# Patient Record
Sex: Female | Born: 1970 | Hispanic: No | Marital: Married | Smoking: Current every day smoker
Health system: Southern US, Community
[De-identification: ages and names within clinical notes are randomized; demographics above are authoritative.]

## PROBLEM LIST (undated history)

## (undated) DIAGNOSIS — F419 Anxiety disorder, unspecified: Secondary | ICD-10-CM

## (undated) DIAGNOSIS — A159 Respiratory tuberculosis unspecified: Secondary | ICD-10-CM

## (undated) DIAGNOSIS — F3281 Premenstrual dysphoric disorder: Secondary | ICD-10-CM

## (undated) DIAGNOSIS — E28319 Asymptomatic premature menopause: Secondary | ICD-10-CM

## (undated) DIAGNOSIS — Z720 Tobacco use: Secondary | ICD-10-CM

## (undated) HISTORY — DX: Respiratory tuberculosis unspecified: A15.9

## (undated) HISTORY — PX: DILATION AND CURETTAGE OF UTERUS: SHX78

## (undated) HISTORY — PX: LESION REMOVAL: SHX5196

## (undated) HISTORY — DX: Asymptomatic premature menopause: E28.319

## (undated) HISTORY — DX: Anxiety disorder, unspecified: F41.9

## (undated) HISTORY — DX: Premenstrual dysphoric disorder: F32.81

## (undated) HISTORY — DX: Tobacco use: Z72.0

---

## 2004-01-18 ENCOUNTER — Inpatient Hospital Stay: Payer: Self-pay | Admitting: Psychiatry

## 2004-07-07 ENCOUNTER — Ambulatory Visit: Payer: Self-pay | Admitting: Family Medicine

## 2004-07-27 ENCOUNTER — Encounter: Payer: Self-pay | Admitting: Family Medicine

## 2004-07-27 LAB — CONVERTED CEMR LAB

## 2004-08-31 ENCOUNTER — Ambulatory Visit: Payer: Self-pay | Admitting: Family Medicine

## 2004-12-28 ENCOUNTER — Ambulatory Visit: Payer: Self-pay | Admitting: Family Medicine

## 2005-04-21 ENCOUNTER — Ambulatory Visit: Payer: Self-pay | Admitting: Family Medicine

## 2005-07-07 ENCOUNTER — Ambulatory Visit: Payer: Self-pay | Admitting: Family Medicine

## 2005-07-17 ENCOUNTER — Ambulatory Visit: Payer: Self-pay | Admitting: Family Medicine

## 2005-08-09 ENCOUNTER — Ambulatory Visit: Payer: Self-pay | Admitting: Family Medicine

## 2006-02-23 ENCOUNTER — Ambulatory Visit: Payer: Self-pay | Admitting: Family Medicine

## 2006-03-18 ENCOUNTER — Ambulatory Visit: Payer: Self-pay

## 2006-03-23 ENCOUNTER — Ambulatory Visit: Payer: Self-pay

## 2006-03-25 ENCOUNTER — Ambulatory Visit: Payer: Self-pay | Admitting: Family Medicine

## 2006-06-08 ENCOUNTER — Ambulatory Visit: Payer: Self-pay | Admitting: Family Medicine

## 2007-01-18 ENCOUNTER — Telehealth (INDEPENDENT_AMBULATORY_CARE_PROVIDER_SITE_OTHER): Payer: Self-pay | Admitting: *Deleted

## 2007-01-20 ENCOUNTER — Telehealth: Payer: Self-pay | Admitting: Family Medicine

## 2007-01-31 ENCOUNTER — Ambulatory Visit: Payer: Self-pay | Admitting: Family Medicine

## 2007-01-31 DIAGNOSIS — N926 Irregular menstruation, unspecified: Secondary | ICD-10-CM | POA: Insufficient documentation

## 2007-07-17 ENCOUNTER — Ambulatory Visit: Payer: Self-pay | Admitting: Family Medicine

## 2007-07-17 DIAGNOSIS — F4323 Adjustment disorder with mixed anxiety and depressed mood: Secondary | ICD-10-CM | POA: Insufficient documentation

## 2007-07-17 DIAGNOSIS — K219 Gastro-esophageal reflux disease without esophagitis: Secondary | ICD-10-CM | POA: Insufficient documentation

## 2007-07-17 DIAGNOSIS — F17200 Nicotine dependence, unspecified, uncomplicated: Secondary | ICD-10-CM | POA: Insufficient documentation

## 2007-07-17 DIAGNOSIS — J309 Allergic rhinitis, unspecified: Secondary | ICD-10-CM | POA: Insufficient documentation

## 2007-12-27 ENCOUNTER — Telehealth: Payer: Self-pay | Admitting: Family Medicine

## 2008-01-19 ENCOUNTER — Ambulatory Visit: Payer: Self-pay | Admitting: Family Medicine

## 2008-01-19 DIAGNOSIS — N943 Premenstrual tension syndrome: Secondary | ICD-10-CM | POA: Insufficient documentation

## 2008-01-19 DIAGNOSIS — G43909 Migraine, unspecified, not intractable, without status migrainosus: Secondary | ICD-10-CM | POA: Insufficient documentation

## 2008-01-23 ENCOUNTER — Telehealth: Payer: Self-pay | Admitting: Family Medicine

## 2008-05-01 ENCOUNTER — Ambulatory Visit: Payer: Self-pay | Admitting: Family Medicine

## 2008-05-01 LAB — CONVERTED CEMR LAB
Bilirubin Urine: NEGATIVE
Glucose, Urine, Semiquant: NEGATIVE
Ketones, urine, test strip: NEGATIVE
Nitrite: NEGATIVE
Urobilinogen, UA: 0.2
WBC Urine, dipstick: NEGATIVE
pH: 6

## 2008-07-02 ENCOUNTER — Ambulatory Visit: Payer: Self-pay | Admitting: Family Medicine

## 2008-08-16 ENCOUNTER — Telehealth: Payer: Self-pay | Admitting: Family Medicine

## 2008-08-19 ENCOUNTER — Telehealth: Payer: Self-pay | Admitting: Family Medicine

## 2008-08-27 ENCOUNTER — Telehealth: Payer: Self-pay | Admitting: Family Medicine

## 2008-11-12 ENCOUNTER — Telehealth: Payer: Self-pay | Admitting: Family Medicine

## 2008-12-09 ENCOUNTER — Encounter: Payer: Self-pay | Admitting: Family Medicine

## 2009-01-24 ENCOUNTER — Ambulatory Visit: Payer: Self-pay | Admitting: Internal Medicine

## 2009-02-25 ENCOUNTER — Telehealth (INDEPENDENT_AMBULATORY_CARE_PROVIDER_SITE_OTHER): Payer: Self-pay | Admitting: Internal Medicine

## 2009-02-27 ENCOUNTER — Ambulatory Visit: Payer: Self-pay | Admitting: Family Medicine

## 2009-02-27 ENCOUNTER — Telehealth: Payer: Self-pay | Admitting: Family Medicine

## 2009-02-28 ENCOUNTER — Telehealth (INDEPENDENT_AMBULATORY_CARE_PROVIDER_SITE_OTHER): Payer: Self-pay | Admitting: Internal Medicine

## 2009-03-03 ENCOUNTER — Telehealth (INDEPENDENT_AMBULATORY_CARE_PROVIDER_SITE_OTHER): Payer: Self-pay | Admitting: Internal Medicine

## 2009-03-07 ENCOUNTER — Encounter (INDEPENDENT_AMBULATORY_CARE_PROVIDER_SITE_OTHER): Payer: Self-pay | Admitting: Internal Medicine

## 2009-03-10 ENCOUNTER — Telehealth (INDEPENDENT_AMBULATORY_CARE_PROVIDER_SITE_OTHER): Payer: Self-pay | Admitting: Internal Medicine

## 2009-05-03 ENCOUNTER — Ambulatory Visit: Payer: Self-pay | Admitting: Family Medicine

## 2009-05-09 ENCOUNTER — Telehealth: Payer: Self-pay | Admitting: Family Medicine

## 2009-05-14 ENCOUNTER — Ambulatory Visit: Payer: Self-pay | Admitting: Family Medicine

## 2009-06-06 ENCOUNTER — Encounter: Admission: RE | Admit: 2009-06-06 | Discharge: 2009-06-06 | Payer: Self-pay | Admitting: Family Medicine

## 2009-06-06 ENCOUNTER — Ambulatory Visit: Payer: Self-pay | Admitting: Family Medicine

## 2009-06-06 LAB — CONVERTED CEMR LAB
Bilirubin Urine: NEGATIVE
Glucose, Urine, Semiquant: NEGATIVE
Ketones, urine, test strip: NEGATIVE
Nitrite: NEGATIVE
Urobilinogen, UA: 0.2
WBC Urine, dipstick: NEGATIVE

## 2009-08-06 ENCOUNTER — Telehealth: Payer: Self-pay | Admitting: Family Medicine

## 2009-08-07 ENCOUNTER — Ambulatory Visit: Payer: Self-pay | Admitting: Family Medicine

## 2009-08-15 ENCOUNTER — Ambulatory Visit: Payer: Self-pay | Admitting: Family Medicine

## 2009-10-11 ENCOUNTER — Ambulatory Visit: Payer: Self-pay | Admitting: Family Medicine

## 2009-10-11 DIAGNOSIS — N959 Unspecified menopausal and perimenopausal disorder: Secondary | ICD-10-CM | POA: Insufficient documentation

## 2009-11-03 ENCOUNTER — Ambulatory Visit: Payer: Self-pay | Admitting: Family Medicine

## 2009-11-03 DIAGNOSIS — N951 Menopausal and female climacteric states: Secondary | ICD-10-CM | POA: Insufficient documentation

## 2009-11-17 ENCOUNTER — Telehealth: Payer: Self-pay | Admitting: Family Medicine

## 2010-02-24 ENCOUNTER — Telehealth: Payer: Self-pay | Admitting: Family Medicine

## 2010-03-26 ENCOUNTER — Ambulatory Visit: Payer: Self-pay | Admitting: Family Medicine

## 2010-04-28 NOTE — Letter (Signed)
Summary: Out of Work  [facility]  [address] [place], [place] [address]   Phone: [phone]  Fax: [phone]    June 06, 2009   Employee:  Beckey Downing    To Whom It May Concern:   For Medical reasons, please excuse the above named employee from work for the following dates:  Start:   06/06/2009  End:   can return 06/07/2009 if she is feeling better   If you need additional information, please feel free to contact our office.         Sincerely,    Judith Part MD

## 2010-04-28 NOTE — Progress Notes (Signed)
Summary: wants to increase prozac dose  Phone Note Call from Patient Call back at Home Phone [phone]   Caller: Patient Call For: Judith Part MD Summary of Call: Pt is taking prozac 10 mg's daily since august and she thinks she needs to increase her dose.  It is not as effective as it was.  She says her premenstrual sxs are severe.  Uses [facility] Initial call taken by: Lowella Petties CMA, AAMA,  February 24, 2010 4:48 PM  Follow-up for Phone Call        we can go up to 20 mg instead of 10 px written on EMR for call in  update if side eff or worse symptoms  f/u with me in [place] or feb  Follow-up by: Judith Part MD,  February 24, 2010 5:24 PM  Additional Follow-up for Phone Call Additional follow up Details #1::        Left message for patient to call back. Lewanda Rife LPN  February 25, 2010 9:58 AM   Medication phoned [facility] as instructed. Lewanda Rife LPN  February 25, 2010 11:54 AM   Advised pt, follow up appt made. Additional Follow-up by: Lowella Petties CMA, AAMA,  February 25, 2010 12:27 PM    New/Updated Medications: PROZAC 20 MG CAPS (FLUOXETINE HCL) 1 by mouth once daily Prescriptions: PROZAC 20 MG CAPS (FLUOXETINE HCL) 1 by mouth once daily  #30 x 11   Entered and Authorized by:   Judith Part MD   Signed by:   Lewanda Rife LPN on 40/12/2723   Method used:   Telephoned to ...       [facility]  [place] [facility]ail)       [address]       [place], [place]  [address]       Ph: [phone] or [phone]       Fax: [phone]   RxID:   [id number]

## 2010-04-28 NOTE — Assessment & Plan Note (Signed)
Summary: FEVER,HA,THROWING UP,DIZZY/CLE   Vital Signs:  Patient profile:   40 year old female Weight:      144.25 pounds Temp:     98.9 degrees F oral Pulse rate:   84 / minute Pulse rhythm:   regular BP sitting:   114 / 80  (left arm) Cuff size:   regular  Vitals Entered By: Sydell Axon LPN (Aug 07, 2009 12:31 PM) CC: Stomach cramps, diarrhea, headache, dizzy, throwing up and fever, symptoms started Monday   History of Present Illness: Pt here for dizziness which began Tues after she began with AGE sxs on Mon.  She has fever on and off 99+ to 100.2 gets hot and cold with chills. She has headache in the frontal area and eyes. She has no  pain in the ears, no rhinitis and no nasal congestion, no ST except from vomiting, no cough, mld stomach pain and then sore from vomiting. The pain began with crampy feeling with sourness. She was very bloated yesterday. 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 Her stool was black but not tarry this AM.  She has tried [employer] relief pepto pills, IBP and Gasex.   Problems Prior to Update: 1)  Back Pain  (ICD-724.5) 2)  Premenstrual Dysphoric Syndrome  (ICD-625.4) 3)  Migraine  (ICD-346.90) 4)  Tobacco Abuse  (ICD-305.1) 5)  Gerd  (ICD-530.81) 6)  Depression  (ICD-311) 7)  Anxiety  (ICD-300.00) 8)  Allergic Rhinitis  (ICD-477.9) 9)  Irregular Menses  (ICD-626.4)  Medications Prior to Update: 1)  Flonase 50 Mcg/act Susp (Fluticasone Propionate) .... 2 Sprays in Each Nostril Once Daily As Needed 2)  Patanol 0.1 % Soln (Olopatadine Hcl) .Marland Kitchen.. 1 Drop in Each Eye Two Times A Day As Needed Allergy Symptoms 3)  Zyrtec Allergy 10 Mg Tabs (Cetirizine Hcl) .Marland Kitchen.. 1 By Mouth Once Daily in Allergy Season 4)  Tums 500 Mg Chew (Calcium Carbonate Antacid) .... Otc As Directed. 5)  Nexium 40 Mg Cpdr (Esomeprazole Magnesium) .... Take 1 Each Morning 30-60 Min Before Food or Fluids, Then Must Eat or Drink 6)  Promethazine Hcl 25 Mg Tabs (Promethazine  Hcl) .Marland Kitchen.. 1 Q 4 Hours As Needed Nausea 7)  Sam-E 200 Mg Tbec (S-Adenosylmethionine) .... Otc As Directed. 8)  Mobic 15 Mg Tabs (Meloxicam) .Marland Kitchen.. 1 By Mouth Once Daily With Food For 2 Weeks (Stop If Any Gi Upset) 9)  Flexeril 10 Mg Tabs (Cyclobenzaprine Hcl) .... 1/2 To 1 By Mouth Up To Three Times A Day As Needed Back Pain  Warn- This May Sedate  Allergies: 1)  ! Effexor 2)  ! * Depo Provera 3)  ! Paxil  Physical Exam  General:  Well-developed,well-nourished,in no acute distress; alert,appropriate and cooperative throughout examination, looks tired but nontoxic. Head:  normocephalic, atraumatic, and no abnormalities observed.  Sinuses minimally tender in  max distr. Eyes:  Conjunctiva clear bilaterally. Mucosa well hydrated. Ears:  External ear exam shows no significant lesions or deformities.  Otoscopic examination reveals clear canals, tympanic membranes are intact bilaterally without bulging, retraction, inflammation or discharge. Hearing is grossly normal bilaterally. Nose:  External nasal examination shows no deformity or inflammation. Nasal mucosa are pink and moist without lesions or exudates. Mouth:  Oral mucosa and oropharynx without lesions or exudates.  Teeth in good repair. Mucous membr moist. Neck:  nl rom , no bony tenderness  Chest Wall:  No deformities, masses, or tenderness noted. Lungs:  Normal respiratory effort, chest expands symmetrically. Lungs are clear  to auscultation, no crackles or wheezes. Heart:  Normal rate and regular rhythm. S1 and S2 normal without gallop, murmur, click, rub or other extra sounds. Abdomen:  Bowel sounds positive,abdomen soft and non-tender without masses, organomegaly or hernias noted. Bloating resolved, currently flat and NT, no rebound or referred.   Impression & Recommendations:  Problem # 1:  GASTROENTERITIS, ACUTE (ICD-558.9) Assessment New  HAd similar episode in Feb and did well. Still has some Phenergan from then. See  instructions.  Discussed use of medication and role of diet. Encouraged clear liquids and electrolyte replacement fluids. Instructed to call if any signs of worsening dehydration.   Complete Medication List: 1)  Flonase 50 Mcg/act Susp (Fluticasone propionate) .... 2 sprays in each nostril once daily as needed 2)  Patanol 0.1 % Soln (Olopatadine hcl) .Marland Kitchen.. 1 drop in each eye two times a day as needed allergy symptoms 3)  Zyrtec Allergy 10 Mg Tabs (Cetirizine hcl) .Marland Kitchen.. 1 by mouth once daily in allergy season 4)  Tums 500 Mg Chew (Calcium carbonate antacid) .... Otc as directed. 5)  Nexium 40 Mg Cpdr (Esomeprazole magnesium) .... Take 1 each morning 30-60 min before food or fluids, then must eat or drink 6)  Promethazine Hcl 25 Mg Tabs (Promethazine hcl) .Marland Kitchen.. 1 every 4 hours as needed nausea 7)  Sam-e 200 Mg Tbec (S-adenosylmethionine) .... Otc as directed.  Patient Instructions: 1)  Use phenergqn every 6 hrs today. Take again first thing in the AM. Then reassess need as day goes on. 2)  Take clear liqs today...may include Chicken Noodle soup, Coca Cola  and Jello. 3)  If doing well may advance to BRAT tomm. 4)  Avoid milk and milk prods for a week. 5)  Assume black stoool from the Pepto. 6)  May use Gasex for bloating.  7)  RTC if sxs cont.Ifmdizziness worsens of mouth becomes dry or unable to keep down fluids, to ER for IV trmt.  Current Allergies (reviewed today): ! EFFEXOR ! * DEPO PROVERA ! PAXIL

## 2010-04-28 NOTE — Assessment & Plan Note (Signed)
Summary: menstual issues/dlo   Vital Signs:  Patient profile:   40 year old female Height:      66 inches Weight:      146 pounds BMI:     23.65 Temp:     98.2 degrees F oral Pulse rate:   80 / minute Pulse rhythm:   regular BP sitting:   116 / 70  (left arm) Cuff size:   regular  Vitals Entered By: Lewanda Rife LPN (November 03, 2009 12:31 PM) CC: No period for 3 months. Pt went to GYN and told beginning menopause. Wants to talk with DR Milinda Antis   History of Present Illness: in mid june started to have hot flashes and night sweats and went [facility]  did thyroid test and PL? at the office   was told she was in menopause  no menses since mid may   hot flashes are unbearable  no energy  feels overwhelmed - like she does not want to go to work anymore irritable - moody and nervous  this comes and goes is miserable  given supplement with some herbs to help for osteopenia   did not talk about treatments  is supposed to f/u in a month- appt in aug   mother went through early menopause also - at 40   really wants to stop smoking   has taken samE-- and it has helped a little  also bought a menopause product        Allergies: 1)  ! Effexor 2)  ! * Depo Provera 3)  ! Paxil  Past History:  Past Surgical History: Last updated: 07/17/2007 D & C Lesion removed from finger  Family History: Last updated: 07/17/2007 Father: DM Mother:  Siblings:   Social History: Last updated: 01/19/2008 current smoker  heavy caffiene intake   Risk Factors: Smoking Status: current (07/17/2007)  Past Medical History: PMDD anx/depression allergic rhinitis and conjunctivitis tabacco abuse  migraine  early menopause at 37  Review of Systems General:  Complains of fatigue; denies chills, fever, loss of appetite, and malaise. Eyes:  Denies blurring and eye irritation. CV:  Denies chest pain or discomfort, lightheadness, and palpitations. Resp:  Denies cough and  wheezing. GI:  Denies abdominal pain, bloody stools, change in bowel habits, and indigestion. GU:  Denies dysuria and nocturia; some vaginal dryness . MS:  Denies joint pain. Derm:  Denies itching, lesion(s), poor wound healing, and rash. Neuro:  Denies numbness and tingling. Endo:  Complains of heat intolerance; denies excessive thirst. Heme:  Denies abnormal bruising and bleeding.  Physical Exam  General:  Well-developed,well-nourished,in no acute distress; alert,appropriate and cooperative throughout examination Head:  Normocephalic and atraumatic without obvious abnormalities.  Eyes:  vision grossly intact, pupils equal, pupils round, and pupils reactive to light.   Mouth:  pharynx pink and moist.   Neck:  supple with full rom and no masses or thyromegally, no JVD or carotid bruit  Lungs:  CTA with diffusely distant bs  Heart:  Normal rate and regular rhythm. S1 and S2 normal without gallop, murmur, click, rub or other extra sounds. Msk:  No deformity or scoliosis noted of thoracic or lumbar spine.   Extremities:  No clubbing, cyanosis, edema, or deformity noted with normal full range of motion of all joints.   Neurologic:  sensation intact to light touch, gait normal, and DTRs symmetrical and normal.  no tremor  Skin:  Intact without suspicious lesions or rashes Cervical Nodes:  No lymphadenopathy  noted Psych:  slt anxious today good eye contact and comm skills   Impression & Recommendations:  Problem # 1:  MENOPAUSE, EARLY (ICD-627.2) Assessment New with vasomotor and mood symptoms that are almost disabling  spent 25 minutes face to face time with pt , over 50% of which was spent on counseling and coordination of care   will send for gyn records  choose not to start hrt in light of smoking and blood clot risk disc options -incl otc herbs/ black kohash  disc pros/ cons/ risks hrt- will disc with gyn  also give prozac low dose px to try for mood swings (rev poss side eff and  warned to stop if worse dep)  disc imp of ca/ D and exercise for bones also  Complete Medication List: 1)  Flonase 50 Mcg/act Susp (Fluticasone propionate) .... 2 sprays in each nostril once daily as needed 2)  Patanol 0.1 % Soln (Olopatadine hcl) .Marland Kitchen.. 1 drop in each eye two times a day as needed allergy symptoms 3)  Zyrtec Allergy 10 Mg Tabs (Cetirizine hcl) .Marland Kitchen.. 1 by mouth once daily in allergy season 4)  Tums 500 Mg Chew (Calcium carbonate antacid) .... Otc as directed. 5)  Nexium 40 Mg Cpdr (Esomeprazole magnesium) .... Take 1 each morning 30-60 min before food or fluids, then must eat or drink 6)  Promethazine Hcl 25 Mg Tabs (Promethazine hcl) .Marland Kitchen.. 1 every 4 hours as needed nausea 7)  Sam-e 200 Mg Tbec (S-adenosylmethionine) .... Otc as directed. 8)  Robitussin Dm 100-10 Mg/28ml Syrp (Dextromethorphan-guaifenesin) .... Otc as directed. 9)  Prozac 10 Mg Caps (Fluoxetine hcl) .Marland Kitchen.. 1 by mouth once daily in am  Patient Instructions: 1)  please send for last note/ labs [facility] 2)  try black kohash / estroven/ soy products 3)  can also try prozac (if worse with that - stop it or if any other side effects) 4)  try to get enough exercise  5)  the current recommendation for calcium intake is 1200-1500 mg daily with [phone] IU of vitamin D  6)  work on quitting smoking 7)  follow up with your gyn as planned  Prescriptions: PROZAC 10 MG CAPS (FLUOXETINE HCL) 1 by mouth once daily in am  #30 x 11   Entered and Authorized by:   Judith Part MD   Signed by:   Judith Part MD on 11/03/2009   Method used:   Print then Give to Patient   RxID:   [id number]   Current Allergies (reviewed today): ! EFFEXOR ! * DEPO PROVERA ! PAXIL

## 2010-04-28 NOTE — Assessment & Plan Note (Signed)
Summary: headaches since last sun/nausea   Vital Signs:  Patient profile:   40 year old female Height:      66 inches (167.64 cm) Weight:      145 pounds (65.91 kg) BMI:     23.49 O2 Sat:      99 % on Room air Temp:     98.2 degrees F (36.78 degrees C) oral Pulse rate:   80 / minute BP sitting:   126 / 82  (left arm) Cuff size:   regular  Vitals Entered By: Brenton Grills MA (October 11, 2009 9:39 AM)  O2 Flow:  Room air CC: pt c/o HA x 6 days with nausea/aj   CC:  pt c/o HA x 6 days with nausea/aj.  History of Present Illness: 40 y/o fem smoker who comes in for eval of DUB/headaches x 6 days.  IUD by gyn.last gyn visit 2 years ago..........no mensesw in may, then 3 days of spotting 6/16 to 6/19.hot flushers too.....Marland Kitchensmoker.10 day  ha on top of head.comes/goes.a 5 (1 to 1o).........Marland Kitchenneuro ros neg  Current Medications (verified): 1)  Flonase 50 Mcg/act Susp (Fluticasone Propionate) .... 2 Sprays in Each Nostril Once Daily As Needed 2)  Patanol 0.1 % Soln (Olopatadine Hcl) .Marland Kitchen.. 1 Drop in Each Eye Two Times A Day As Needed Allergy Symptoms 3)  Zyrtec Allergy 10 Mg Tabs (Cetirizine Hcl) .Marland Kitchen.. 1 By Mouth Once Daily in Allergy Season 4)  Tums 500 Mg Chew (Calcium Carbonate Antacid) .... Otc As Directed. 5)  Nexium 40 Mg Cpdr (Esomeprazole Magnesium) .... Take 1 Each Morning 30-60 Min Before Food or Fluids, Then Must Eat or Drink 6)  Promethazine Hcl 25 Mg Tabs (Promethazine Hcl) .Marland Kitchen.. 1 Every 4 Hours As Needed Nausea 7)  Sam-E 200 Mg Tbec (S-Adenosylmethionine) .... Otc As Directed. 8)  Robitussin Dm 100-10 Mg/2ml Syrp (Dextromethorphan-Guaifenesin) .... Otc As Directed. 9)  Zithromax Z-Pak 250 Mg Tabs (Azithromycin) .... Take By Mouth As Directed 10)  Guaifenesin-Codeine 100-10 Mg/28ml Soln (Guaifenesin-Codeine) .Marland Kitchen.. 1-2 Teaspoons By Mouth At Bedtime As Needed Severe Cough  Allergies (verified): 1)  ! Effexor 2)  ! * Depo Provera 3)  ! Paxil  Past History:  Past medical, surgical,  family and social histories (including risk factors) reviewed, and no changes noted (except as noted below).  Past Medical History: Reviewed history from 07/02/2008 and no changes required. PMDD anx/depression allergic rhinitis and conjunctivitis tabacco abuse  migraine   Past Surgical History: Reviewed history from 07/17/2007 and no changes required. D & C Lesion removed from finger  Family History: Reviewed history from 07/17/2007 and no changes required. Father: DM Mother:  Siblings:   Social History: Reviewed history from 01/19/2008 and no changes required. current smoker  heavy caffiene intake   Review of Systems      See HPI  Physical Exam  General:  Well-developed,well-nourished,in no acute distress; alert,appropriate and cooperative throughout examination Head:  Normocephalic and atraumatic without obvious abnormalities. No apparent alopecia or balding. Eyes:  No corneal or conjunctival inflammation noted. EOMI. Perrla. Funduscopic exam benign, without hemorrhages, exudates or papilledema. Vision grossly normal. Ears:  External ear exam shows no significant lesions or deformities.  Otoscopic examination reveals clear canals, tympanic membranes are intact bilaterally without bulging, retraction, inflammation or discharge. Hearing is grossly normal bilaterally. Nose:  External nasal examination shows no deformity or inflammation. Nasal mucosa are pink and moist without lesions or exudates. Mouth:  Oral mucosa and oropharynx without lesions or exudates.  Teeth in good repair.  Neurologic:  No cranial nerve deficits noted. Station and gait are normal. Plantar reflexes are down-going bilaterally. DTRs are symmetrical throughout. Sensory, motor and coordinative functions appear intact.   Problems:  Medical Problems Added: 1)  Dx of Disorder, Menopausal Nos  (ICD-627.9)  Impression & Recommendations:  Problem # 1:  DISORDER, MENOPAUSAL NOS (ICD-627.9) Assessment  New  Complete Medication List: 1)  Flonase 50 Mcg/act Susp (Fluticasone propionate) .... 2 sprays in each nostril once daily as needed 2)  Patanol 0.1 % Soln (Olopatadine hcl) .Marland Kitchen.. 1 drop in each eye two times a day as needed allergy symptoms 3)  Zyrtec Allergy 10 Mg Tabs (Cetirizine hcl) .Marland Kitchen.. 1 by mouth once daily in allergy season 4)  Tums 500 Mg Chew (Calcium carbonate antacid) .... Otc as directed. 5)  Nexium 40 Mg Cpdr (Esomeprazole magnesium) .... Take 1 each morning 30-60 min before food or fluids, then must eat or drink 6)  Promethazine Hcl 25 Mg Tabs (Promethazine hcl) .Marland Kitchen.. 1 every 4 hours as needed nausea 7)  Sam-e 200 Mg Tbec (S-adenosylmethionine) .... Otc as directed. 8)  Robitussin Dm 100-10 Mg/66ml Syrp (Dextromethorphan-guaifenesin) .... Otc as directed. 9)  Zithromax Z-pak 250 Mg Tabs (Azithromycin) .... Take by mouth as directed 10)  Guaifenesin-codeine 100-10 Mg/2ml Soln (Guaifenesin-codeine) .Marland Kitchen.. 1-2 teaspoons by mouth at bedtime as needed severe cough  Patient Instructions: 1)  see gyn asap for eval ,and pcp to start smoking sessesion program

## 2010-04-28 NOTE — Assessment & Plan Note (Signed)
Summary: VOMITTING/NAUSEA/DLO   Vital Signs:  Patient profile:   40 year old female Weight:      145 pounds Temp:     98.1 degrees F oral BP sitting:   102 / 70  (left arm) Cuff size:   regular  Vitals Entered By: Alfred Levins, CMA (May 03, 2009 9:26 AM) CC: n/v, dizziness, sinus pressure x4 days   History of Present Illness: Here for 2 days of mild HA, dizziness, ST, dry cough, and vomitting. No fever or body aches or diarrhea. Drinking plenty of fluids. Using Tylenol and Pepto-Bismol. Her daughter had the same symptoms earlier in the week, and now she is back to normal.   Allergies: 1)  ! Effexor 2)  ! * Depo Provera  Past History:  Past Medical History: Reviewed history from 07/02/2008 and no changes required. PMDD anx/depression allergic rhinitis and conjunctivitis tabacco abuse  migraine   Past Surgical History: Reviewed history from 07/17/2007 and no changes required. D & C Lesion removed from finger  Review of Systems  The patient denies anorexia, fever, weight loss, weight gain, vision loss, decreased hearing, hoarseness, chest pain, syncope, dyspnea on exertion, peripheral edema, hemoptysis, abdominal pain, melena, hematochezia, severe indigestion/heartburn, hematuria, incontinence, genital sores, muscle weakness, suspicious skin lesions, transient blindness, difficulty walking, depression, unusual weight change, abnormal bleeding, enlarged lymph nodes, angioedema, breast masses, and testicular masses.    Physical Exam  General:  Well-developed,well-nourished,in no acute distress; alert,appropriate and cooperative throughout examination Head:  Normocephalic and atraumatic without obvious abnormalities. No apparent alopecia or balding. Eyes:  No corneal or conjunctival inflammation noted. EOMI. Perrla. Funduscopic exam benign, without hemorrhages, exudates or papilledema. Vision grossly normal. Ears:  External ear exam shows no significant lesions or  deformities.  Otoscopic examination reveals clear canals, tympanic membranes are intact bilaterally without bulging, retraction, inflammation or discharge. Hearing is grossly normal bilaterally. Nose:  External nasal examination shows no deformity or inflammation. Nasal mucosa are pink and moist without lesions or exudates. Mouth:  Oral mucosa and oropharynx without lesions or exudates.  Teeth in good repair. Neck:  No deformities, masses, or tenderness noted. Lungs:  Normal respiratory effort, chest expands symmetrically. Lungs are clear to auscultation, no crackles or wheezes. Heart:  Normal rate and regular rhythm. S1 and S2 normal without gallop, murmur, click, rub or other extra sounds. Abdomen:  Bowel sounds positive,abdomen soft and non-tender without masses, organomegaly or hernias noted.   Impression & Recommendations:  Problem # 1:  VIRAL INFECTION (ICD-079.99)  Complete Medication List: 1)  Prilosec 20 Mg Cpdr (Omeprazole) .... 2 by mouth once daily 2)  Paxil 20 Mg Tabs (Paroxetine hcl) .Marland Kitchen.. 1 by mouth once daily two weeks prior to menstrual cycle then 1/2 tablet daily 3)  Flonase 50 Mcg/act Susp (Fluticasone propionate) .... 2 sprays in each nostril once daily as needed 4)  Patanol 0.1 % Soln (Olopatadine hcl) .Marland Kitchen.. 1 drop in each eye two times a day as needed allergy symptoms 5)  Zyrtec Allergy 10 Mg Tabs (Cetirizine hcl) .Marland Kitchen.. 1 by mouth once daily in allergy season 6)  Tums 500 Mg Chew (Calcium carbonate antacid) .... Otc as directed. 7)  Dexilant 60 Mg Cpdr (Dexlansoprazole) .... Take 1 each morning before food or drink by 30-34min 8)  Nexium 40 Mg Cpdr (Esomeprazole magnesium) .... Take 1 each morning 30-60 min before food or fluids, then must eat or drink 9)  Promethazine Hcl 25 Mg Tabs (Promethazine hcl) .Marland Kitchen.. 1 q 4 hours as needed  nausea  Patient Instructions: 1)  rest, fluids, Phenergan as needed for nausea. 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  2)  Please  schedule a follow-up appointment as needed .  Prescriptions: PROMETHAZINE HCL 25 MG TABS (PROMETHAZINE HCL) 1 q 4 hours as needed nausea  #30 x 1   Entered and Authorized by:   Nelwyn Salisbury MD   Signed by:   Nelwyn Salisbury MD on 05/03/2009   Method used:   [facility]        [facility]  [place] [facility]       [address]       [place], [place]  [address]       Ph: [phone] or [phone]       Fax: [phone]   RxID:   [id number]

## 2010-04-28 NOTE — Progress Notes (Signed)
Summary: Paroxetine refill  Phone Note Refill Request Call back at [phone] ([facility]) Message from:  [facility] on November 17, 2009 11:55 AM  Electronic request for Paroxetine 20mg  1 by mouth once daily. Not on med list. Please advise   Method Requested: Electronic Initial call taken by: Janee Morn CMA Duncan Dull),  November 17, 2009 11:56 AM  Follow-up for Phone Call        med list has prozac instead-please check with her Follow-up by: Judith Part MD,  November 17, 2009 1:22 PM  Additional Follow-up for Phone Call Additional follow up Details #1::        Spoke with pt she said was error she does not take Paroxetine. Notified Nicole at [facility] to d/c Paroxetine refills.Lewanda Rife LPN  November 17, 2009 2:26 PM

## 2010-04-28 NOTE — Assessment & Plan Note (Signed)
Summary: BACK PAIN/CLE   Vital Signs:  Patient profile:   40 year old female Height:      66 inches Weight:      145.75 pounds BMI:     23.61 Temp:     98.4 degrees F oral Pulse rate:   72 / minute Pulse rhythm:   regular BP sitting:   104 / 68  (left arm) Cuff size:   regular  Vitals Entered By: Lewanda Rife LPN (June 06, 2009 11:34 AM)  History of Present Illness: has been having back ache for 5-6 mo -- thought due to menses now is constant  is R lower back spreading down into her buttocks similar to what she had last year  worse to bend and wear heels  no good position  walking makes it throb a bit  some otc ibuprofen or tylenol occas   ua looks clear today no urine symptosm - just an acidy smell   back pain does go down her R leg  no change in blader or bowel control no numb or weakness or trouble walking   Allergies: 1)  ! Effexor 2)  ! * Depo Provera 3)  ! Paxil  Past History:  Past Medical History: Last updated: 07/02/2008 PMDD anx/depression allergic rhinitis and conjunctivitis tabacco abuse  migraine   Past Surgical History: Last updated: 07/17/2007 D & C Lesion removed from finger  Family History: Last updated: 07/17/2007 Father: DM Mother:  Siblings:   Social History: Last updated: 01/19/2008 current smoker  heavy caffiene intake   Risk Factors: Smoking Status: current (07/17/2007)  Review of Systems General:  Denies chills, fatigue, fever, loss of appetite, malaise, weakness, and weight loss. Eyes:  Denies blurring. CV:  Denies chest pain or discomfort and palpitations. Resp:  Denies cough and shortness of breath. GI:  Denies abdominal pain, change in bowel habits, indigestion, loss of appetite, and nausea. GU:  Denies discharge, dysuria, hematuria, and urinary frequency. MS:  Complains of low back pain and stiffness; denies joint redness, joint swelling, cramps, and muscle weakness. Derm:  Denies lesion(s), poor wound healing,  and rash. Neuro:  Denies numbness, tingling, and weakness. Heme:  Denies abnormal bruising and bleeding.  Physical Exam  General:  Well-developed,well-nourished,in no acute distress; alert,appropriate and cooperative throughout examination Head:  normocephalic, atraumatic, and no abnormalities observed.   Eyes:  vision grossly intact, pupils equal, pupils round, and pupils reactive to light.   Neck:  nl rom , no bony tenderness  Chest Wall:  No deformities, masses, or tenderness noted. Lungs:  Normal respiratory effort, chest expands symmetrically. Lungs are clear to auscultation, no crackles or wheezes. Heart:  Normal rate and regular rhythm. S1 and S2 normal without gallop, murmur, click, rub or other extra sounds. Abdomen:  no suprapubic tenderness or fullness felt  Msk:  tender L5- S1 bony pain in R perilumbar musculature and also SI joint some pain on slr in back (not leg) some pain on ext rot of R hip  flex 30 deg andext 10 deg with pain pain on R lat bend nl gait  no cva tenderness  Extremities:  No clubbing, cyanosis, edema, or deformity noted with normal full range of motion of all joints.   Neurologic:  strength normal in all extremities, sensation intact to light touch, gait normal, and DTRs symmetrical and normal.   Skin:  Intact without suspicious lesions or rashes Inguinal Nodes:  No significant adenopathy Psych:  normal affect, talkative and pleasant    Impression &  Recommendations:  Problem # 1:  BACK PAIN (ICD-724.5) Assessment Deteriorated recurrent R low back pain with some rad to leg suspect muscle spasm with sciatica - no neurol changes on exam sent for x ray in light of length of symptoms  nsaid and muscle relaxer as needed with caution back handout aafp heat and stretches as tol  then will update further  Her updated medication list for this problem includes:    Mobic 15 Mg Tabs (Meloxicam) .Marland Kitchen... 1 by mouth once daily with food for 2 weeks (stop if any  gi upset)    Flexeril 10 Mg Tabs (Cyclobenzaprine hcl) .Marland Kitchen... 1/2 to 1 by mouth up to three times a day as needed back pain  warn- this may sedate  Orders: Radiology Referral (Radiology)  Complete Medication List: 1)  Flonase 50 Mcg/act Susp (Fluticasone propionate) .... 2 sprays in each nostril once daily as needed 2)  Patanol 0.1 % Soln (Olopatadine hcl) .Marland Kitchen.. 1 drop in each eye two times a day as needed allergy symptoms 3)  Zyrtec Allergy 10 Mg Tabs (Cetirizine hcl) .Marland Kitchen.. 1 by mouth once daily in allergy season 4)  Tums 500 Mg Chew (Calcium carbonate antacid) .... Otc as directed. 5)  Nexium 40 Mg Cpdr (Esomeprazole magnesium) .... Take 1 each morning 30-60 min before food or fluids, then must eat or drink 6)  Promethazine Hcl 25 Mg Tabs (Promethazine hcl) .Marland Kitchen.. 1 q 4 hours as needed nausea 7)  Sam-e 200 Mg Tbec (S-adenosylmethionine) .... Otc as directed. 8)  Mobic 15 Mg Tabs (Meloxicam) .Marland Kitchen.. 1 by mouth once daily with food for 2 weeks (stop if any gi upset) 9)  Flexeril 10 Mg Tabs (Cyclobenzaprine hcl) .... 1/2 to 1 by mouth up to three times a day as needed back pain  warn- this may sedate  Patient Instructions: 1)  we will schedule x ray at check out  2)  update me if pain worsens  3)  caution with muscle relaxer- can sedate Prescriptions: FLEXERIL 10 MG TABS (CYCLOBENZAPRINE HCL) 1/2 to 1 by mouth up to three times a day as needed back pain  warn- this may sedate  #30 x 0   Entered and Authorized by:   Judith Part MD   Signed by:   Judith Part MD on 06/06/2009   Method used:   Print then Give to Patient   RxID:   [id number] MOBIC 15 MG TABS (MELOXICAM) 1 by mouth once daily with food for 2 weeks (stop if any GI upset)  #14 x 0   Entered and Authorized by:   Judith Part MD   Signed by:   Judith Part MD on 06/06/2009   Method used:   Print then Give to Patient   RxID:   [id number]   Current Allergies (reviewed today): ! EFFEXOR ! * DEPO PROVERA !  PAXIL  Laboratory Results   Urine Tests  Date/Time Received: June 06, 2009 11:36 AM  Date/Time Reported: June 06, 2009 11:36 AM   Routine Urinalysis   Color: yellow Appearance: Hazy Glucose: negative   (Normal Range: Negative) Bilirubin: negative   (Normal Range: Negative) Ketone: negative   (Normal Range: Negative) Spec. Gravity: 1.020   (Normal Range: 1.003-1.035) Blood: trace-intact   (Normal Range: Negative) pH: 6.0   (Normal Range: 5.0-8.0) Protein: trace   (Normal Range: Negative) Urobilinogen: 0.2   (Normal Range: 0-1) Nitrite: negative   (Normal Range: Negative) Leukocyte Esterace: negative   (Normal Range: Negative)

## 2010-04-28 NOTE — Letter (Signed)
Summary: Out of Work  [facility]  [address] [place], [place] [address]   Phone: [phone]  Fax: [phone]    Aug 15, 2009   Employee:  Beckey Downing    To Whom It May Concern:   For Medical reasons, please excuse the above named employee from work for the following dates:  Start:   08/15/2009  End:   can retrun 5/23 if she is feeling better   If you need additional information, please feel free to contact our office.         Sincerely,    Judith Part MD

## 2010-04-28 NOTE — Progress Notes (Signed)
Summary: vomiting and diarrhea  Phone Note Call from Patient   Caller: Patient Call For: Judith Part MD Summary of Call: Pt called complaining of vomiting and diarrhea since yesterday. No fever.  She has only vomited x once today.  Advised pt to rest, take frequent small sips of clear fluids, avoid dairy products and no solid foods for now.  She will call back if not better. Initial call taken by: Lowella Petties CMA,  Aug 06, 2009 12:35 PM  Follow-up for Phone Call        agree with that advice  f/u or seek care if abd pain/ fever over 102 / worse or signs/ symptoms of dehydration Follow-up by: Judith Part MD,  Aug 06, 2009 1:19 PM  Additional Follow-up for Phone Call Additional follow up Details #1::        Advised pt. Additional Follow-up by: Lowella Petties CMA,  Aug 06, 2009 2:47 PM

## 2010-04-28 NOTE — Progress Notes (Signed)
Summary: sinus symptoms  Phone Note Call from Patient   Caller: Patient Call For: Judith Part MD Summary of Call: [facility] and was dx'd with a virus.  She still has dizziness, cough, nausea, sinus pain, nose is dry and sore. She is requesting an abx.  I advised pt that she would need to be seen, suggested she go back [facility] tomorrow and see Dr. Hetty Ely.  She said she was just there last week, said she would go to urgent care. Initial call taken by: Lowella Petties CMA,  May 09, 2009 10:15 AM  Follow-up for Phone Call        Pt called stated she had been advised by triage last week that it was more than likely a virus and triage asked if she'd like to schedule an appt.  Pt said she wanted same day appt and if couldn't get it would go to Sat clinic.  Pt stated she went to Sat clinic and was told it was a virus.  Pt also went to urgent care on Sat night and says they did flu test and gave her Tamiflu.  Pt says still having all the same symptoms and is requesting an antibiotic. Verified phone number with patient and asked if I could check with the doctor and call her right back.  She agreed that this would be okay.  Spoke with Dr. Milinda Antis and Dr. Milinda Antis said she will review chart and follow up.  Called patient back and left a voicemail that Dr. Milinda Antis would review after lunch and follow up.   Follow-up by: Clarisa Schools,  May 09, 2009 12:26 PM  Additional Follow-up for Phone Call Additional follow up Details #1::        can go ahead and tx for possible sinusitis and then have her f/u next week [facility] clinic if worse tomorrow px written on EMR for call in-- amox   Additional Follow-up by: Judith Part MD,  May 09, 2009 1:29 PM    New/Updated Medications: AMOXICILLIN 500 MG CAPS (AMOXICILLIN) 1 by mouth three times a day for 10 days Prescriptions: AMOXICILLIN 500 MG CAPS (AMOXICILLIN) 1 by mouth three times a day for 10 days   #30 x 0   Entered by:   Delilah Shan CMA (AAMA)   Authorized by:   Judith Part MD   Signed by:   Delilah Shan CMA (AAMA) on 05/09/2009   Method used:   Electronically to        [facility]  [address] (retail)       [address]       [place], [place]  [address]       Ph: [phone] or [phone]       Fax: [phone]   RxID:   [id number] AMOXICILLIN 500 MG CAPS (AMOXICILLIN) 1 by mouth three times a day for 10 days  #30 x 0   Entered and Authorized by:   Judith Part MD   Signed by:   Judith Part MD on 05/09/2009   Method used:   Telephoned to ...       [facility] (retail)       [address]       [place], [place]  [address]       Ph: [phone] or [phone]       Fax: [phone]   RxID:   [id number]  Electronically to  [facility] (retail)       [address]       [place], [place]  [address]       Ph: [phone] or [phone]       Fax: [phone]

## 2010-04-28 NOTE — Assessment & Plan Note (Signed)
Summary: FEVER/CONGESTION/COUGH/SORE THROAT/DLO   Vital Signs:  Patient profile:   40 year old female Height:      66 inches Weight:      144.50 pounds BMI:     23.41 Temp:     101 degrees F oral Pulse rate:   88 / minute Pulse rhythm:   regular BP sitting:   110 / 80  (left arm) Cuff size:   regular  Vitals Entered By: Lewanda Rife LPN (Aug 15, 2009 9:32 AM) CC: fever, head and chest congestion, non productive cough   History of Present Illness: has bad cold symptoms  started on tuesday  throat was scratchy - then chest buring with cough  face and throat hurt  post nasal drip- no color  non prod cough   101 fever -- aches all over - legs and back   taking some robitussin and tylenol   still smokes-- abut the same      Allergies: 1)  ! Effexor 2)  ! * Depo Provera 3)  ! Paxil  Past History:  Past Medical History: Last updated: 07/02/2008 PMDD anx/depression allergic rhinitis and conjunctivitis tabacco abuse  migraine   Past Surgical History: Last updated: 07/17/2007 D & C Lesion removed from finger  Family History: Last updated: 07/17/2007 Father: DM Mother:  Siblings:   Social History: Last updated: 01/19/2008 current smoker  heavy caffiene intake   Risk Factors: Smoking Status: current (07/17/2007)  Review of Systems General:  Complains of chills, fatigue, fever, loss of appetite, and malaise. Eyes:  Denies blurring and eye irritation. ENT:  Complains of nasal congestion, postnasal drainage, sinus pressure, and sore throat. CV:  Denies chest pain or discomfort, palpitations, shortness of breath with exertion, and swelling of feet. Resp:  Complains of cough, sputum productive, and wheezing; denies shortness of breath. Derm:  Denies lesion(s), poor wound healing, and rash.  Physical Exam  Head:  mild maxillary sinus tenderness Lungs:  harsh bs at bases/occ rhonchi and end exp wheeze (mild) not sob  harsh cough   Impression &  Recommendations:  Problem # 1:  BRONCHITIS- ACUTE (ICD-466.0) Assessment New in smoker with minimal reactive airways after uri  tx with zithromax  recommend sympt care- see pt instructions  -- use codiene cough syrup with caution pt advised to update me if symptoms worsen or do not improve - esp if worse wheezing Her updated medication list for this problem includes:    Robitussin Dm 100-10 Mg/77ml Syrp (Dextromethorphan-guaifenesin) ..... Otc as directed.    Zithromax Z-pak 250 Mg Tabs (Azithromycin) .Marland Kitchen... Take by mouth as directed    Guaifenesin-codeine 100-10 Mg/74ml Soln (Guaifenesin-codeine) .Marland Kitchen... 1-2 teaspoons by mouth at bedtime as needed severe cough  Problem # 2:  TOBACCO ABUSE (ICD-305.1) Assessment: Unchanged discussed in detail risks of smoking, and possible outcomes including COPD, vascular dz, cancer and also respiratory infections/sinus problems  adv to quit   Complete Medication List: 1)  Flonase 50 Mcg/act Susp (Fluticasone propionate) .... 2 sprays in each nostril once daily as needed 2)  Patanol 0.1 % Soln (Olopatadine hcl) .Marland Kitchen.. 1 drop in each eye two times a day as needed allergy symptoms 3)  Zyrtec Allergy 10 Mg Tabs (Cetirizine hcl) .Marland Kitchen.. 1 by mouth once daily in allergy season 4)  Tums 500 Mg Chew (Calcium carbonate antacid) .... Otc as directed. 5)  Nexium 40 Mg Cpdr (Esomeprazole magnesium) .... Take 1 each morning 30-60 min before food or fluids, then must eat or drink 6)  Promethazine  Hcl 25 Mg Tabs (Promethazine hcl) .Marland Kitchen.. 1 every 4 hours as needed nausea 7)  Sam-e 200 Mg Tbec (S-adenosylmethionine) .... Otc as directed. 8)  Robitussin Dm 100-10 Mg/75ml Syrp (Dextromethorphan-guaifenesin) .... Otc as directed. 9)  Zithromax Z-pak 250 Mg Tabs (Azithromycin) .... Take by mouth as directed 10)  Guaifenesin-codeine 100-10 Mg/39ml Soln (Guaifenesin-codeine) .Marland Kitchen.. 1-2 teaspoons by mouth at bedtime as needed severe cough  Patient Instructions: 1)  take the zithromax for  bronchitis 2)  think hard about quitting smoking  3)  drink lots of fluids 4)  robitussin DM is ok for day 5)  try the px cough med at night - it can sedate  6)  update me if not starting to feel better next week  7)  use your inhaler as needed  Prescriptions: GUAIFENESIN-CODEINE 100-10 MG/5ML SOLN (GUAIFENESIN-CODEINE) 1-2 teaspoons by mouth at bedtime as needed severe cough  #120cc x 0   Entered and Authorized by:   Judith Part MD   Signed by:   Judith Part MD on 08/15/2009   Method used:   Print then Give to Patient   RxID:   [id number] ZITHROMAX Z-PAK 250 MG TABS (AZITHROMYCIN) take by mouth as directed  #1 pack x 0   Entered and Authorized by:   Judith Part MD   Signed by:   Judith Part MD on 08/15/2009   Method used:   Print then Give to Patient   RxID:   [id number]   Current Allergies (reviewed today): ! EFFEXOR ! * DEPO PROVERA ! PAXIL

## 2010-04-28 NOTE — Assessment & Plan Note (Signed)
Summary: ALLERGIES/CLE   Vital Signs:  Patient profile:   40 year old female Height:      66 inches Weight:      148.75 pounds BMI:     24.10 Temp:     97.7 degrees F oral Pulse rate:   76 / minute Pulse rhythm:   regular BP sitting:   100 / 70  (left arm) Cuff size:   regular  Vitals Entered By: Lewanda Rife LPN (May 14, 2009 12:40 PM)  History of Present Illness: a bad winter with respiratory illnesses  continuous sinus probs since oct   influenza - went to the walk in clinic  did give her tamiflu for the flu - and finished that then dx with uri viral    px amox over the phone for likely sinus infx-- increased sinus pain and fever  now is having a lot of ear problems -- full and popping but not pain  feels like something is walking in her ears  still blowing out green nasal d/c -- but is slowing down a bit  facial pain is now imp - just dryness  no fever   itchy throat from allergies- weather change  using zyrtec and also flonase -- 2 in each nostril once per day or splits it up   no more n/v    Allergies: 1)  ! Effexor 2)  ! * Depo Provera 3)  ! Paxil  Past History:  Past Medical History: Last updated: 07/02/2008 PMDD anx/depression allergic rhinitis and conjunctivitis tabacco abuse  migraine   Past Surgical History: Last updated: 07/17/2007 D & C Lesion removed from finger  Family History: Last updated: 07/17/2007 Father: DM Mother:  Siblings:   Social History: Last updated: 01/19/2008 current smoker  heavy caffiene intake   Risk Factors: Smoking Status: current (07/17/2007)  Review of Systems General:  Denies fatigue, fever, loss of appetite, and malaise. Eyes:  Denies blurring and eye pain. ENT:  Complains of earache, hoarseness, nasal congestion, postnasal drainage, and sinus pressure; denies ear discharge and sore throat. CV:  Denies chest pain or discomfort, lightheadness, palpitations, and shortness of breath with  exertion. Resp:  Denies cough and wheezing. GI:  Denies abdominal pain, bloody stools, change in bowel habits, and constipation. MS:  Denies joint pain. Derm:  Denies lesion(s), poor wound healing, and rash. Endo:  Denies excessive thirst and excessive urination.  Physical Exam  General:  Well-developed,well-nourished,in no acute distress; alert,appropriate and cooperative throughout examination Head:  normocephalic, atraumatic, and no abnormalities observed.  bilat max sinus tenderness Eyes:  vision grossly intact, pupils equal, pupils round, pupils reactive to light, and no injection.   Ears:  R ear normal and L ear normal.  - TMs are dull with some cerumen on R  Nose:  nares are injected and congested bilaterally  Mouth:  pharynx pink and moist, no erythema, and no exudates.   Neck:  No deformities, masses, or tenderness noted. Lungs:  Normal respiratory effort, chest expands symmetrically. Lungs are clear to auscultation, no crackles or wheezes. Heart:  Normal rate and regular rhythm. S1 and S2 normal without gallop, murmur, click, rub or other extra sounds. Skin:  Intact without suspicious lesions or rashes Cervical Nodes:  No lymphadenopathy noted Psych:  normal affect, talkative and pleasant    Impression & Recommendations:  Problem # 1:  SINUSITIS - ACUTE-NOS (ICD-461.9) Assessment New overall improved with amox - still with symptoms of ETD enc to continue flonase/ finish abx  nasal saline  and mucinex for depression  update if not further imp in 1 wk Her updated medication list for this problem includes:    Flonase 50 Mcg/act Susp (Fluticasone propionate) .Marland Kitchen... 2 sprays in each nostril once daily as needed    Amoxicillin 500 Mg Caps (Amoxicillin) .Marland Kitchen... 1 by mouth three times a day for 10 days  Complete Medication List: 1)  Flonase 50 Mcg/act Susp (Fluticasone propionate) .... 2 sprays in each nostril once daily as needed 2)  Patanol 0.1 % Soln (Olopatadine hcl) .Marland Kitchen.. 1  drop in each eye two times a day as needed allergy symptoms 3)  Zyrtec Allergy 10 Mg Tabs (Cetirizine hcl) .Marland Kitchen.. 1 by mouth once daily in allergy season 4)  Tums 500 Mg Chew (Calcium carbonate antacid) .... Otc as directed. 5)  Nexium 40 Mg Cpdr (Esomeprazole magnesium) .... Take 1 each morning 30-60 min before food or fluids, then must eat or drink 6)  Promethazine Hcl 25 Mg Tabs (Promethazine hcl) .Marland Kitchen.. 1 q 4 hours as needed nausea 7)  Amoxicillin 500 Mg Caps (Amoxicillin) .Marland Kitchen.. 1 by mouth three times a day for 10 days  Patient Instructions: 1)  finish the amoxicillin as planned  2)  try plain mucinex two times a day as directed for congestion in head and chest  3)  drink lots of water  4)  use nasal saline spray several times daily 5)  stay on allergy medicines  6)  update me if ear symptoms do not improve in a week  Current Allergies (reviewed today): ! EFFEXOR ! * DEPO PROVERA ! PAXIL

## 2010-04-30 NOTE — Assessment & Plan Note (Signed)
Summary: FOLLOW UP   Vital Signs:  Patient profile:   40 year old female Weight:      151 pounds BMI:     24.46 Temp:     97.7 degrees F oral Pulse rate:   72 / minute Pulse rhythm:   regular BP sitting:   112 / 80  (left arm) Cuff size:   regular  Vitals Entered By: Mervin Hack CMA Duncan Dull) (March 26, 2010 8:37 AM) CC: follow-up visit   History of Present Illness: here for f/u of mood disorder primarily linked to menses/ pmdd and some perimenopausal change   wt is up 5 lb-- she is upset about recent weight gain    bp good 112/80  called and we did inc prozac to 20 mg one daily   (in past non tol of paxil and effexor) prior to that was irrtable/ frustrated and miserable  after menses there is also improvement   this month did not have the pmdd -- and thankful for that  peroids are irregular  was out of the country -- just got back last night -- to [place]  got to relax and sit on the beach -- that helped a lot   does not want to go up on the dose   is not exercising  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 Effexor 2)  ! * Depo Provera 3)  ! Paxil  Past History:  Past Medical History: Last updated: 11/03/2009 PMDD anx/depression allergic rhinitis and conjunctivitis tabacco abuse  migraine  early menopause at 59  Past Surgical History: Last updated: 07/17/2007 D & C Lesion removed from finger  Family History: Last updated: 07/17/2007 Father: DM Mother:  Siblings:   Social History: Last updated: 01/19/2008 current smoker  heavy caffiene intake   Risk Factors: Smoking Status: current (07/17/2007)  Review of Systems General:  Denies fatigue, loss of appetite, and malaise. Eyes:  Denies blurring and eye  irritation. CV:  Denies chest pain or discomfort and lightheadness. Resp:  Denies cough, shortness of breath, and wheezing. GI:  Denies abdominal pain, change in bowel habits, indigestion, and nausea. MS:  Denies cramps. Derm:  Denies rash. Neuro:  Denies headaches. Psych:  Complains of easily tearful and irritability; denies sense of great danger and suicidal thoughts/plans. Endo:  Denies cold intolerance, excessive thirst, excessive urination, and heat intolerance. Heme:  Denies abnormal bruising and bleeding.  Physical Exam  General:  Well-developed,well-nourished,in no acute distress; alert,appropriate and cooperative throughout examination Head:  normocephalic, atraumatic, and no abnormalities observed.   Eyes:  vision grossly intact, pupils equal, pupils round, and pupils reactive to light.  no conjunctival pallor, injection or icterus  Mouth:  pharynx pink and moist.   Neck:  supple with full rom and no masses or thyromegally, no JVD or carotid bruit  Lungs:  Normal respiratory effort, chest expands symmetrically. Lungs are clear to auscultation, no crackles or wheezes. Heart:  Normal rate and regular rhythm. S1 and S2 normal without gallop, murmur, click, rub or other extra sounds. Msk:  No deformity or scoliosis noted of thoracic or lumbar spine.   Pulses:  R and L carotid,radial,femoral,dorsalis pedis and  posterior tibial pulses are full and equal bilaterally Extremities:  No clubbing, cyanosis, edema, or deformity noted with normal full range of motion of all joints.   Neurologic:  sensation intact to light touch, gait normal, and DTRs symmetrical and normal.   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 for above- pt will consider this  spent 25 minutes face to face time with pt , over 50% of which was spent on counseling and coordination of care  rev tx opt and poss side eff- I think this is  a good choice  exercise is most imp- disc making time for that  Problem # 2:  TOBACCO ABUSE (ICD-305.1) Assessment: Unchanged again disc this in detail discussed in detail risks of smoking, and possible outcomes including COPD, vascular dz, cancer and also respiratory infections/sinus problems  pt is getting ready / contemplating quitting disc nicotine repl as an opt to help  Problem # 3:  DISORDER, MENOPAUSAL NOS (ICD-627.9) Assessment: Deteriorated pt is frustrated by perimenopausal symptoms -- esp sleep disorder and wt gain disc imp of exercise for both - and made plan for healthier lifestyle disc expectations for menopause  Complete Medication List: 1)  Prozac 20 Mg Caps (Fluoxetine hcl) .Marland Kitchen.. 1 by mouth once daily 2)  Flonase 50 Mcg/act Susp (Fluticasone propionate) .... 2 sprays in each nostril once daily as needed 3)  Zyrtec Allergy 10 Mg Tabs (Cetirizine hcl) .Marland Kitchen.. 1 by mouth once daily in allergy season 4)  Tums 500 Mg Chew (Calcium carbonate antacid) .... Otc as directed. 5)  Promethazine Hcl 25 Mg Tabs (Promethazine hcl) .Marland Kitchen.. 1 every 4 hours as needed nausea 6)  Prevacid 15 Mg Cpdr (Lansoprazole) .Marland Kitchen.. 1 by mouth once daily otc  Patient Instructions: 1)  stay on this dose of prozac  2)  It is important that you exercise reguarly at least 20 minutes 5 times a week. If you develop chest pain, have severe difficulty breathing, or feel very tired, stop exercising immediately and seek medical attention. -- this will help weight and energy level and stress  3)  keep thinking about quitting smoking  4)  follow up for physical (labs before) in 6 months     Orders Added: 1)  Est. Patient Level IV [42595]    Current Allergies (reviewed today): ! EFFEXOR ! * DEPO PROVERA ! PAXIL

## 2010-08-04 ENCOUNTER — Inpatient Hospital Stay (INDEPENDENT_AMBULATORY_CARE_PROVIDER_SITE_OTHER)
Admission: RE | Admit: 2010-08-04 | Discharge: 2010-08-04 | Disposition: A | Discharge status: Home / Self Care | Payer: BC Managed Care – PPO | Source: Ambulatory Visit | Attending: Family Medicine | Admitting: Family Medicine

## 2010-08-04 DIAGNOSIS — H00019 Hordeolum externum unspecified eye, unspecified eyelid: Secondary | ICD-10-CM

## 2010-09-14 ENCOUNTER — Other Ambulatory Visit: Payer: Self-pay

## 2010-09-17 ENCOUNTER — Encounter: Payer: Self-pay | Admitting: Family Medicine

## 2010-09-18 ENCOUNTER — Encounter: Payer: Self-pay | Admitting: Family Medicine

## 2010-09-30 ENCOUNTER — Encounter: Payer: Self-pay | Admitting: Family Medicine

## 2010-10-04 ENCOUNTER — Telehealth: Payer: Self-pay | Admitting: Family Medicine

## 2010-10-04 DIAGNOSIS — Z0000 Encounter for general adult medical examination without abnormal findings: Secondary | ICD-10-CM | POA: Insufficient documentation

## 2010-10-04 NOTE — Telephone Encounter (Signed)
Message copied by Judy Pimple on Sun Oct 04, 2010 12:08 PM ------      Message from: Baldomero Lamy      Created: Thu Oct 01, 2010 10:18 AM      Regarding: Cpx labs mon       Please order  future cpx labs for pt's upcomming lab appt.      Thanks      Rodney Booze

## 2010-10-05 ENCOUNTER — Other Ambulatory Visit (INDEPENDENT_AMBULATORY_CARE_PROVIDER_SITE_OTHER): Payer: BC Managed Care – PPO | Admitting: Family Medicine

## 2010-10-05 DIAGNOSIS — F419 Anxiety disorder, unspecified: Secondary | ICD-10-CM

## 2010-10-05 DIAGNOSIS — F411 Generalized anxiety disorder: Secondary | ICD-10-CM

## 2010-10-05 DIAGNOSIS — Z0000 Encounter for general adult medical examination without abnormal findings: Secondary | ICD-10-CM

## 2010-10-05 LAB — CBC WITH DIFFERENTIAL/PLATELET
Eosinophils Absolute: 0.3 10*3/uL (ref 0.0–0.7)
Eosinophils Relative: 4.1 % (ref 0.0–5.0)
Hemoglobin: 11.2 g/dL — ABNORMAL LOW (ref 12.0–15.0)
Lymphocytes Relative: 29.9 % (ref 12.0–46.0)
MCV: 81.9 fl (ref 78.0–100.0)
Neutro Abs: 3.7 10*3/uL (ref 1.4–7.7)
Neutrophils Relative %: 55.4 % (ref 43.0–77.0)
Platelets: 351 10*3/uL (ref 150.0–400.0)

## 2010-10-05 LAB — COMPREHENSIVE METABOLIC PANEL
ALT: 20 U/L (ref 0–35)
AST: 25 U/L (ref 0–37)
Alkaline Phosphatase: 87 U/L (ref 39–117)
CO2: 22 mEq/L (ref 19–32)
Calcium: 9.1 mg/dL (ref 8.4–10.5)
Chloride: 106 mEq/L (ref 96–112)
GFR: 89.9 mL/min (ref >60.00)
Potassium: 3.7 mEq/L (ref 3.5–5.1)
Sodium: 138 mEq/L (ref 135–145)
Total Bilirubin: 0.5 mg/dL (ref 0.3–1.2)

## 2010-10-05 LAB — LIPID PANEL
HDL: 42.5 mg/dL (ref >39.00)
Total CHOL/HDL Ratio: 4

## 2010-10-09 ENCOUNTER — Ambulatory Visit (INDEPENDENT_AMBULATORY_CARE_PROVIDER_SITE_OTHER): Payer: BC Managed Care – PPO | Admitting: Family Medicine

## 2010-10-09 ENCOUNTER — Encounter: Payer: Self-pay | Admitting: Family Medicine

## 2010-10-09 DIAGNOSIS — R739 Hyperglycemia, unspecified: Secondary | ICD-10-CM

## 2010-10-09 DIAGNOSIS — N926 Irregular menstruation, unspecified: Secondary | ICD-10-CM

## 2010-10-09 DIAGNOSIS — R7303 Prediabetes: Secondary | ICD-10-CM | POA: Insufficient documentation

## 2010-10-09 DIAGNOSIS — Z0000 Encounter for general adult medical examination without abnormal findings: Secondary | ICD-10-CM

## 2010-10-09 DIAGNOSIS — R7309 Other abnormal glucose: Secondary | ICD-10-CM

## 2010-10-09 DIAGNOSIS — Z23 Encounter for immunization: Secondary | ICD-10-CM

## 2010-10-09 DIAGNOSIS — F17200 Nicotine dependence, unspecified, uncomplicated: Secondary | ICD-10-CM

## 2010-10-09 LAB — HEMOGLOBIN A1C: Hgb A1c MFr Bld: 6.4 % (ref 4.6–6.5)

## 2010-10-09 NOTE — Assessment & Plan Note (Signed)
Disc in detail risks of smoking and possible outcomes including copd, vascular/ heart disease, cancer , respiratory and sinus infections  Pt voices understanding  Given info on [facility] program to quit Commended on cutting down

## 2010-10-09 NOTE — Assessment & Plan Note (Signed)
New- fasting sugar 133 with high glucose diet Disc low glycemic diet  Check a1c today

## 2010-10-09 NOTE — Patient Instructions (Addendum)
For mild anemia (from your peroids) please start over the counter iron supplement -- ferrous sulfate 325 mg daily  Tdap vaccine today  Try to avoid sugar / sweets / sugar drinks as much as possible We are checking a1c today- a 3 month diabetes test  Please schedule appt with Dr Patsy Lager for back pain  If you change your mind about mammograms- let me know

## 2010-10-09 NOTE — Assessment & Plan Note (Signed)
Reviewed health habits including diet and exercise and skin cancer prevention Also reviewed health mt list, fam hx and immunizations  Enc pt to consider mammograms for breast cancer screening  Rev wellness labs

## 2010-10-09 NOTE — Progress Notes (Signed)
Subjective:    Patient ID: Beth Reynolds, female    DOB: 29-Mar-1971, 40 y.o.   MRN: [id number]  HPI Here for health mt exam  Is doing well and having a good summer  A lot of school workshops   No new medical problems   Wt is up 4 lb with good bmi fo24  Smoking status-- has cut down to 1/2 ppd -- and proud of that Is really working on quitting  Given info on [facility] program for cessation   Pap- was last year at [facility] are irregular -- skipped June  Also quite heavy - after spotting for a week  Has paraguard iud  Is tolerating it ok now  Has menses now - no abn paps  peroids not worse Does not want pap today   Mam- does not do mammograms - thinks her breasts are too small  Understands risks  Self exam - no lumps at all   Td - ? Last - does want a Tdap   Good chol prof with LDL 71 Lab Results  Component Value Date   CHOL 151 10/05/2010   Lab Results  Component Value Date   HDL 42.50 10/05/2010   Lab Results  Component Value Date   LDLCALC 71 10/05/2010   Lab Results  Component Value Date   TRIG 188.0* 10/05/2010   Lab Results  Component Value Date   CHOLHDL 4 10/05/2010   No results found for this basename: LDLDIRECT     Sugar a bit high at 133 Eats late at night - but no bkfast  DM is in family  Loves sweets - donuts and cakes for breakfast   Is walking for exercise - and goes to the gym     A little anemic at hb of 11.2 Is a little tired  This is from menses - is used to it   Patient Active Problem List  Diagnoses  . ANXIETY  . TOBACCO ABUSE  . DEPRESSION  . MIGRAINE  . ALLERGIC RHINITIS  . GERD  . PREMENSTRUAL DYSPHORIC SYNDROME  . IRREGULAR MENSES  . MENOPAUSE, EARLY  . DISORDER, MENOPAUSAL NOS  . Routine general medical examination at a health care facility  . Hyperglycemia   Past Medical History  Diagnosis Date  . PMDD (premenstrual dysphoric disorder)   . Anxiety   . Allergic rhinoconjunctivitis   . Tobacco abuse     . Migraine   . Early menopause    Past Surgical History  Procedure Date  . Dilation and curettage of uterus   . Lesion removal     from finger   History  Substance Use Topics  . Smoking status: Current Everyday Smoker  . Smokeless tobacco: Not on file  . Alcohol Use: Not on file   Family History  Problem Relation Age of Onset  . Diabetes Father    Allergies  Allergen Reactions  . Paroxetine     REACTION: sever headache, not able to sleep and weight gain  . Venlafaxine     REACTION: HA   Current Outpatient Prescriptions on File Prior to Visit  Medication Sig Dispense Refill  . lansoprazole (PREVACID) 15 MG capsule Take 15 mg by mouth daily.        . calcium carbonate (TUMS - DOSED IN MG ELEMENTAL CALCIUM) 500 MG chewable tablet Chew 1 tablet by mouth as directed.        . cetirizine (ZYRTEC ALLERGY) 10 MG tablet Take 10 mg by mouth  daily. In allergy season       . FLUoxetine (PROZAC) 20 MG capsule Take 20 mg by mouth daily.        . fluticasone (FLONASE) 50 MCG/ACT nasal spray Place 2 sprays into the nose daily as needed.        . promethazine (PHENERGAN) 25 MG tablet Take 25 mg by mouth every 4 (four) hours as needed. nausea              Review of Systems Review of Systems  Constitutional: Negative for fever, appetite change,  and unexpected weight change. occ mild fatigue Eyes: Negative for pain and visual disturbance.  Respiratory: Negative for cough and shortness of breath.   Cardiovascular: Negative for cp or sob or palplitations.   Gastrointestinal: Negative for nausea, diarrhea and constipation.  Genitourinary: Negative for urgency and frequency.  Skin: Negative for pallor or rash .  Neurological: Negative for weakness, light-headedness, numbness and headaches.  Hematological: Negative for adenopathy. Does not bruise/bleed easily.  Psychiatric/Behavioral: Negative for dysphoric mood. The patient is not nervous/anxious.         Objective:   Physical Exam   Constitutional: She appears well-developed and well-nourished. No distress.  HENT:  Head: Normocephalic and atraumatic.  Right Ear: External ear normal.  Left Ear: External ear normal.  Nose: Nose normal.  Mouth/Throat: Oropharynx is clear and moist.  Eyes: Conjunctivae and EOM are normal. Pupils are equal, round, and reactive to light.  Neck: Normal range of motion. Neck supple. No JVD present. Carotid bruit is not present. Erythema present. No thyromegaly present.  Cardiovascular: Normal rate, regular rhythm, normal heart sounds and intact distal pulses.   Pulmonary/Chest: Effort normal and breath sounds normal. No respiratory distress. She has no wheezes.       Diffusely distant bs   Abdominal: Soft. Bowel sounds are normal. She exhibits no distension, no abdominal bruit and no mass. There is no tenderness.  Genitourinary: No breast swelling, tenderness, discharge or bleeding.  Musculoskeletal: Normal range of motion. She exhibits no edema and no tenderness.  Lymphadenopathy:    She has no cervical adenopathy.  Neurological: She is alert. She has normal reflexes. No cranial nerve deficit. Coordination normal.  Skin: Skin is warm and dry. No rash noted. No erythema. No pallor.  Psychiatric: She has a normal mood and affect.          Assessment & Plan:

## 2010-10-09 NOTE — Assessment & Plan Note (Signed)
This continues in perimenopause Has had paraguard iud for 7 years and wants to continue with that  Overall no changes and had nl pap 1 y ago

## 2010-10-12 ENCOUNTER — Telehealth: Payer: Self-pay

## 2010-10-12 NOTE — Telephone Encounter (Signed)
Home and cell # mail box is full and cannot accept message at this time. No answer at work #. Will try again later.

## 2010-10-12 NOTE — Telephone Encounter (Signed)
Patient notified as instructed by telephone. Fasting Lab appt scheduled as instructed 01/08/11 and f/u appt with Dr Milinda Antis 01/15/11.

## 2010-10-12 NOTE — Telephone Encounter (Signed)
Message copied by Patience Musca on Mon Oct 12, 2010 10:47 AM ------      Message from: Roxy Manns A      Created: Sun Oct 11, 2010  6:25 PM       a1c shows borderline diabetes      Watch sugar in diet- eliminate sweets and sweet drinks      Keep servings small of starches/ carbs-- bread/ fruit/pasta/ race       Also higher fiber options are better      Schedule 3 months lab and f/u (a1c and glucose ) for hyperglycemia

## 2010-10-14 ENCOUNTER — Ambulatory Visit (INDEPENDENT_AMBULATORY_CARE_PROVIDER_SITE_OTHER): Payer: BC Managed Care – PPO | Admitting: Family Medicine

## 2010-10-14 ENCOUNTER — Encounter: Payer: Self-pay | Admitting: Family Medicine

## 2010-10-14 DIAGNOSIS — M461 Sacroiliitis, not elsewhere classified: Secondary | ICD-10-CM

## 2010-10-14 DIAGNOSIS — M549 Dorsalgia, unspecified: Secondary | ICD-10-CM

## 2010-10-14 DIAGNOSIS — G8929 Other chronic pain: Secondary | ICD-10-CM

## 2010-10-14 NOTE — Progress Notes (Signed)
Raye Wiens, a 40 y.o. female presents today in the office for the following:    Dr. Milinda Antis requested a consultation regarding 2 year history of posterior R back and buttock pain.  Has had some back pain off and on for about two years. Has been on some antiinflammatoies, and in May it started back up. Mostly in the lower back, cannot sit for a long time. Has to roll off the bed in the morning. Some weakness.  From May until now, cannot do much what she wants. Down into lateral side - always on the right side. Some into thigh - not beyond knee.  Walks. Swims every day. Backstroke.  Generally very active.  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 Originally from [place].  The PMH, PSH, Social History, Family History, Medications, and allergies have been reviewed in [facility], and have been updated if relevant.  REVIEW OF SYSTEMS  GEN: No fevers, chills. Nontoxic. Primarily MSK c/o today. MSK: Detailed in the HPI GI: tolerating PO intake without difficulty Neuro: No numbness, parasthesias, or tingling associated. Otherwise the pertinent positives of the ROS are noted above.    Physical Exam  Blood pressure 110/76, pulse 64, temperature 98 F (36.7 C), temperature source Oral, weight 157 lb (71.215 kg), last menstrual period 10/05/2010.  Gen: Well-developed,well-nourished,in no acute distress; alert,appropriate and cooperative throughout examination HEENT: Normocephalic and atraumatic without obvious abnormalities.  Ears, externally no deformities Pulm: Breathing comfortably in no respiratory distress Range of motion at  the waist:  Flexion: normal Extension: normal Lateral bending: normal Rotation: all normal  No echymosis or edema Rises to examination table with no difficulty Gait: non antalgic  Inspection/Deformity: N Paraspinus T: minimally tender  B Ankle Dorsiflexion (L5,4): 5/5 B Great Toe Dorsiflexion (L5,4): 5/5 Heel Walk (L5): WNL Toe Walk (S1): WNL Rise/Squat (L4):  WNL  SENSORY B Medial Foot (L4): WNL B Dorsum (L5): WNL B Lateral (S1): WNL Light Touch: WNL Pinprick: WNL  REFLEXES Knee (L4): 2+  B SLR, seated: neg B SLR, supine: neg B FABER: neg B Reverse FABER: + ON RIGHT B Greater Troch: NT B Log Roll: neg B Stork: NT B Sciatic Notch: TTP ON THE RIGHT Leg Lengths: equal  abd 4/5 on r  Assessment and Plan: 1.  LBP with SI joint involvement 2. Associated weakness and pain at the R abductors  Recommendations: Fit patient, much of this is SI joint with associated dynamic changes and weakness in pelvic stability. Low back probably contributes, and overall core stability will help. Doubt discogenic.   Reviewed [facility] program and specific SI rehab F/u in 6 weeks Manipulation is often helpful in these cases, as well.  Cc: Dr. Milinda Antis

## 2010-10-14 NOTE — Patient Instructions (Signed)
Sacroiliac Joint Mobilization and Rehab 1. Work on pretzel stretching, shoulder back and leg draped in front. 3-5 sets, 30 sec.. 2. hip abductor rotations. standing, hip flexion and rotation outward then inward. 3 sets, 15 reps. when can do comfortably, add ankle weights starting at 2 pounds.  3. cross over stretching - shoulder back to ground, same side leg crossover. 3-5 sets for 30 min..  4. rolling up and back knees to chest and rocking. 5. sacral tilt - 5 sets, hold for 5-10 seconds  AND SEE THE MORE DETAILED EXERCISES printed

## 2011-01-05 ENCOUNTER — Other Ambulatory Visit: Payer: Self-pay | Admitting: Family Medicine

## 2011-01-05 DIAGNOSIS — R739 Hyperglycemia, unspecified: Secondary | ICD-10-CM

## 2011-01-08 ENCOUNTER — Other Ambulatory Visit (INDEPENDENT_AMBULATORY_CARE_PROVIDER_SITE_OTHER): Payer: BC Managed Care – PPO

## 2011-01-08 DIAGNOSIS — R739 Hyperglycemia, unspecified: Secondary | ICD-10-CM

## 2011-01-08 DIAGNOSIS — R7309 Other abnormal glucose: Secondary | ICD-10-CM

## 2011-01-08 LAB — HEMOGLOBIN A1C: Hgb A1c MFr Bld: 6.1 % (ref 4.6–6.5)

## 2011-01-14 ENCOUNTER — Telehealth: Payer: Self-pay | Admitting: *Deleted

## 2011-01-14 NOTE — Telephone Encounter (Signed)
I had explained to pt when she called that Dr. Milinda Antis was going to discuss results when she came in for a visit.  Pt said she would call to reschedule when able.

## 2011-01-14 NOTE — Telephone Encounter (Signed)
Patient notified as instructed by telephone. 

## 2011-01-14 NOTE — Telephone Encounter (Signed)
Pt called asking for lab results.  She had follow up appt but had to cancel and didn't reschedule.  Results for A1C and glucose given.

## 2011-01-14 NOTE — Telephone Encounter (Signed)
Will disc with her in detail when she does get a chance to f/u

## 2011-01-15 ENCOUNTER — Ambulatory Visit: Payer: BC Managed Care – PPO | Admitting: Family Medicine

## 2011-02-17 ENCOUNTER — Encounter: Payer: Self-pay | Admitting: Family Medicine

## 2011-02-17 ENCOUNTER — Ambulatory Visit (INDEPENDENT_AMBULATORY_CARE_PROVIDER_SITE_OTHER): Payer: BC Managed Care – PPO | Admitting: Family Medicine

## 2011-02-17 VITALS — BP 100/72 | HR 72 | Temp 98.0°F | Wt 154.0 lb

## 2011-02-17 DIAGNOSIS — Z23 Encounter for immunization: Secondary | ICD-10-CM

## 2011-02-17 DIAGNOSIS — N951 Menopausal and female climacteric states: Secondary | ICD-10-CM

## 2011-02-17 DIAGNOSIS — R739 Hyperglycemia, unspecified: Secondary | ICD-10-CM

## 2011-02-17 DIAGNOSIS — R7309 Other abnormal glucose: Secondary | ICD-10-CM

## 2011-02-17 DIAGNOSIS — F17200 Nicotine dependence, unspecified, uncomplicated: Secondary | ICD-10-CM

## 2011-02-17 DIAGNOSIS — N926 Irregular menstruation, unspecified: Secondary | ICD-10-CM

## 2011-02-17 MED ORDER — FLUOXETINE HCL 40 MG PO CAPS: 40.0000 mg | ORAL_CAPSULE | Freq: Every day | ORAL | Status: DC

## 2011-02-17 NOTE — Patient Instructions (Addendum)
Sugar looks good - keep up the good work with diet and exercise Increase prozac to 40 mg once daily (take 2 of the 20 mg )  If mood or menopausal sympotms worsen- let me know  Follow up in about 6 months  Flu shot today  You can use melatonin or benadryl to help sleep too

## 2011-02-17 NOTE — Assessment & Plan Note (Signed)
Disc in detail risks of smoking and possible outcomes including copd, vascular/ heart disease, cancer , respiratory and sinus infections  Pt voices understanding  She is not ready to quit Flu shot today

## 2011-02-17 NOTE — Assessment & Plan Note (Signed)
Early menopause with no menses for 5 mo and conf labs at gyn - this runs in family Not candidate for HRT due to smoking  Will try inc prozac to 40 to help with mood changes and sleep Also melatonin for sleep prn Will keep me updated if side eff or problems

## 2011-02-17 NOTE — Progress Notes (Signed)
Subjective:    Patient ID: Beth Reynolds, female    DOB: July 11, 1970, 40 y.o.   MRN: [id number]  HPI Here for f/u of hyperglycemia and perimenopause   Has paragaurd iud- removed on aug 3rd  Has not had period since July and increased hot flashes - wonders if going into menopause  Last tests at obgyn 1 year ago said she was  Mother had menopause early too -- she was 70  More intense when it is time for her periods - then comes and goes  Does not sleep well - due to them  Tried melatonin -- puts her to sleep / but still wakes     Smoking status -1/2ppd  Does not think she is ready to quit  Aware of the risks  No cough or sob or wheezing at this point  Hyperglycemia  a1c is down from 6.4 to 6.1-- good Has cut a lot of sugar out of her diet -- is using artificial sweetner in tea No more donuts- eats oatmeal and fruit  Wt down 3 lb with bmi of 24 Is walking for exercise -- 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 ANXIETY  . TOBACCO ABUSE  . DEPRESSION  . MIGRAINE  . ALLERGIC RHINITIS  . GERD  . MENOPAUSE, EARLY  . DISORDER, MENOPAUSAL NOS  . Routine general medical examination at a health care facility  . Hyperglycemia   Past Medical History  Diagnosis Date  . PMDD (premenstrual dysphoric disorder)   . Anxiety   . Allergic rhinoconjunctivitis   . Tobacco abuse   . Migraine   . Early menopause    Past Surgical History  Procedure Date  . Dilation and curettage of uterus   . Lesion removal     from finger   History  Substance Use Topics  . Smoking status: Current Everyday Smoker  . Smokeless tobacco: Not on file  . Alcohol Use: Not on file   Family History  Problem Relation Age of Onset  . Diabetes Father    Allergies  Allergen Reactions  . Paroxetine     REACTION: sever headache, not able to sleep and weight gain  . Venlafaxine     REACTION: HA   Current Outpatient  Prescriptions on File Prior to Visit  Medication Sig Dispense Refill  . calcium carbonate (TUMS - DOSED IN MG ELEMENTAL CALCIUM) 500 MG chewable tablet Chew 1 tablet by mouth as directed.        . cetirizine (ZYRTEC ALLERGY) 10 MG tablet Take 10 mg by mouth daily. In allergy season       . fluticasone (FLONASE) 50 MCG/ACT nasal spray Place 2 sprays into the nose daily as needed.        . lansoprazole (PREVACID) 15 MG capsule Take 15 mg by mouth daily.        . promethazine (PHENERGAN) 25 MG tablet Take 25 mg by mouth every 4 (four) hours as needed. nausea                Review of Systems Review of Systems  Constitutional: Negative for fever, appetite change,  Pos for fatigue and difficulty loosing weight  Eyes: Negative for pain and visual disturbance.  Respiratory: Negative for cough and shortness of breath.   Cardiovascular: Negative for cp or palpitations    Gastrointestinal: Negative for nausea, diarrhea and constipation.  Genitourinary: Negative for urgency and  frequency.  Skin: Negative for pallor or rash   Neurological: Negative for weakness, light-headedness, numbness and headaches. pos for poor sleep Hematological: Negative for adenopathy. Does not bruise/bleed easily.  Psychiatric/Behavioral: poss for mood lability with irritability and depressive symptoms , no SI.          Objective:   Physical Exam  Constitutional: She appears well-developed and well-nourished. No distress.  HENT:  Head: Normocephalic and atraumatic.  Right Ear: External ear normal.  Left Ear: External ear normal.  Nose: Nose normal.  Mouth/Throat: Oropharynx is clear and moist.  Eyes: Conjunctivae and EOM are normal. Pupils are equal, round, and reactive to light. No scleral icterus.  Neck: Normal range of motion. Neck supple. No JVD present. Carotid bruit is not present. No thyromegaly present.  Cardiovascular: Normal rate, regular rhythm, normal heart sounds and intact distal pulses.  Exam  reveals no gallop.   Pulmonary/Chest: Effort normal and breath sounds normal. No respiratory distress. She has no wheezes.       Diffusely distant bs   Abdominal: Soft. Bowel sounds are normal. She exhibits no distension and no mass. There is no tenderness.  Musculoskeletal: Normal range of motion. She exhibits no edema.  Lymphadenopathy:    She has no cervical adenopathy.  Neurological: She is alert. She has normal reflexes. She displays no tremor. No cranial nerve deficit. She exhibits normal muscle tone. Coordination normal.  Skin: Skin is warm and dry. No rash noted. No erythema. No pallor.  Psychiatric: She has a normal mood and affect.       Somewhat frustrated and irritable today Answers all questions appropriately  No tearfulness           Assessment & Plan:

## 2011-02-17 NOTE — Assessment & Plan Note (Signed)
a1c is down to 6.1- doing an excellent job with low sugar diet and exercise and weight control Urged to keep it up  Will plan to f/u in 6 mo No symptoms

## 2011-02-26 IMAGING — CR DG LUMBAR SPINE COMPLETE 4+V
5 series · 5 of 5 positions shown · non-contrast
Comparison: None.

CLINICAL DATA: Low back pain radiating to the right leg.

LUMBAR SPINE - COMPLETE 4+ VIEW

[view not recorded (1 of 5)]
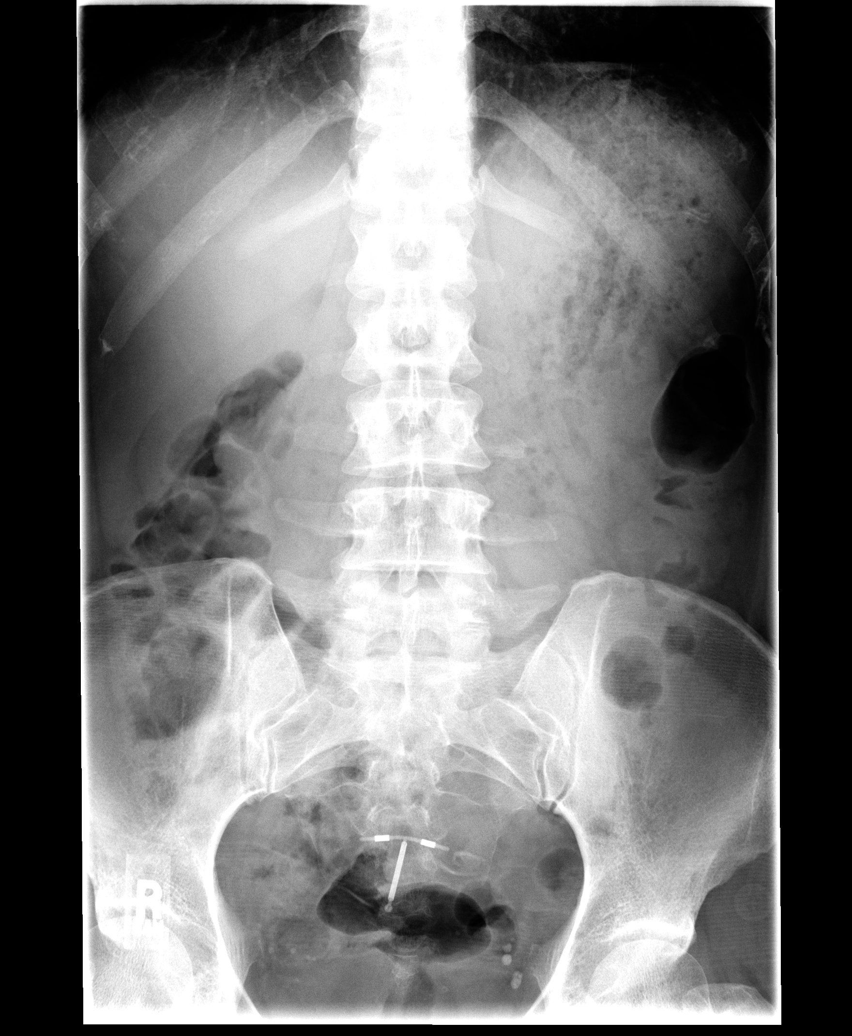

[view not recorded (2 of 5)]
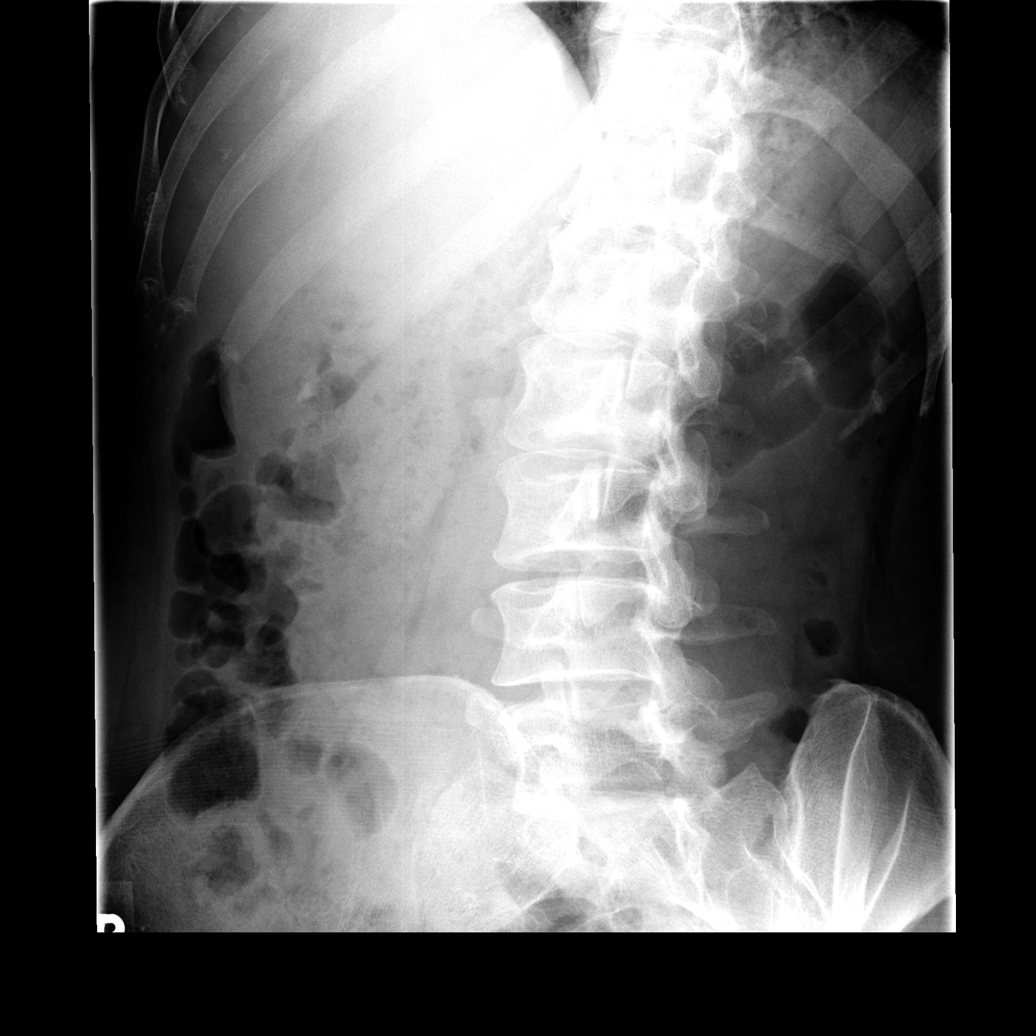

[view not recorded (3 of 5)]
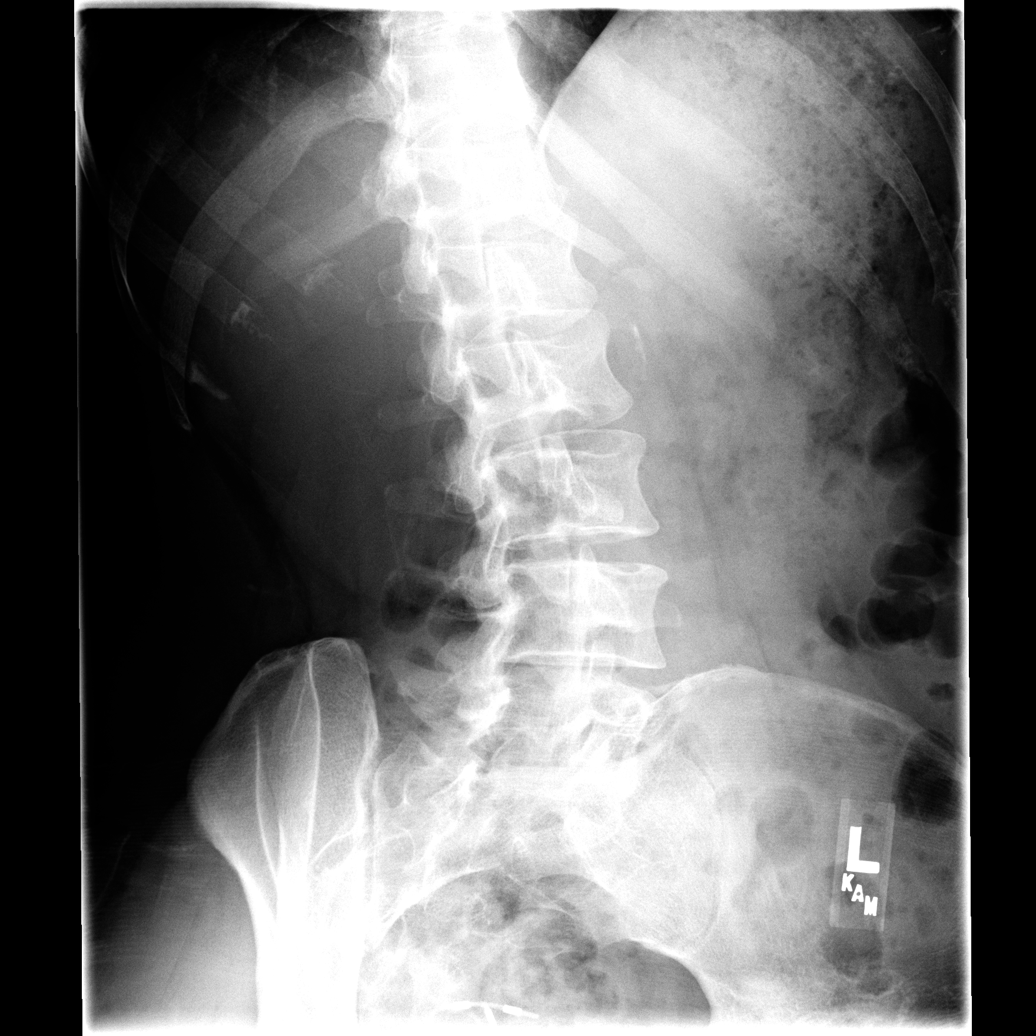

[view not recorded (4 of 5)]
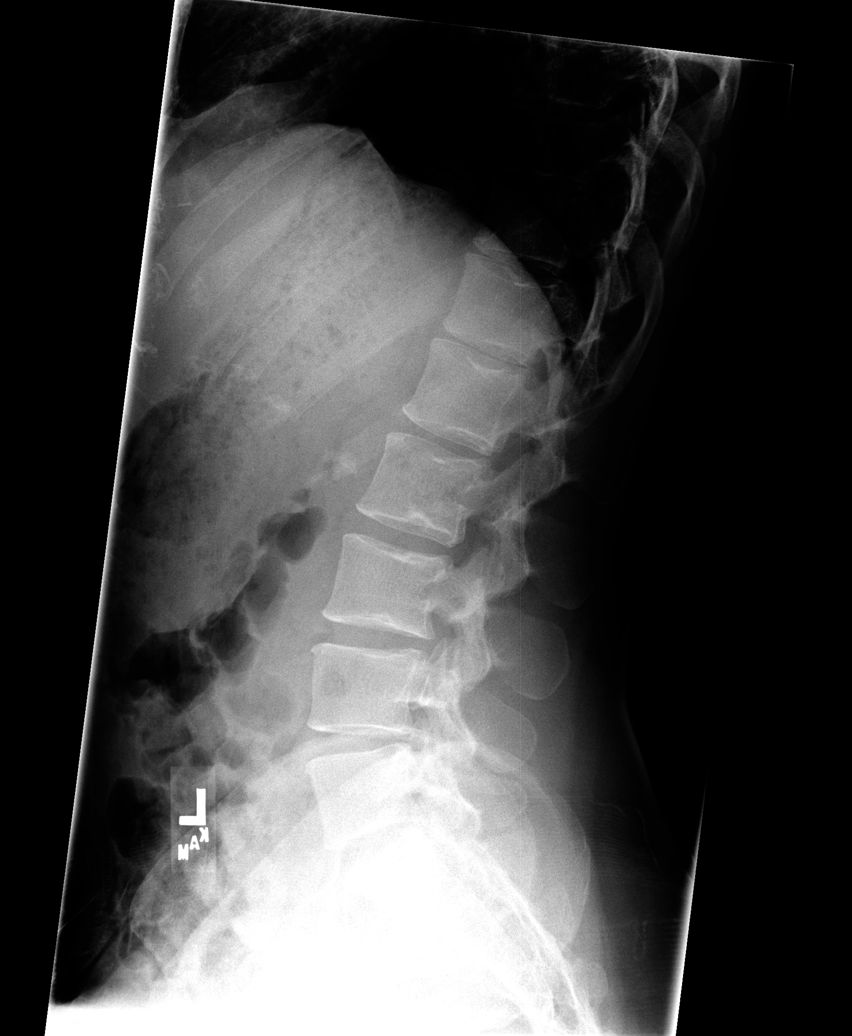

[view not recorded (5 of 5)]
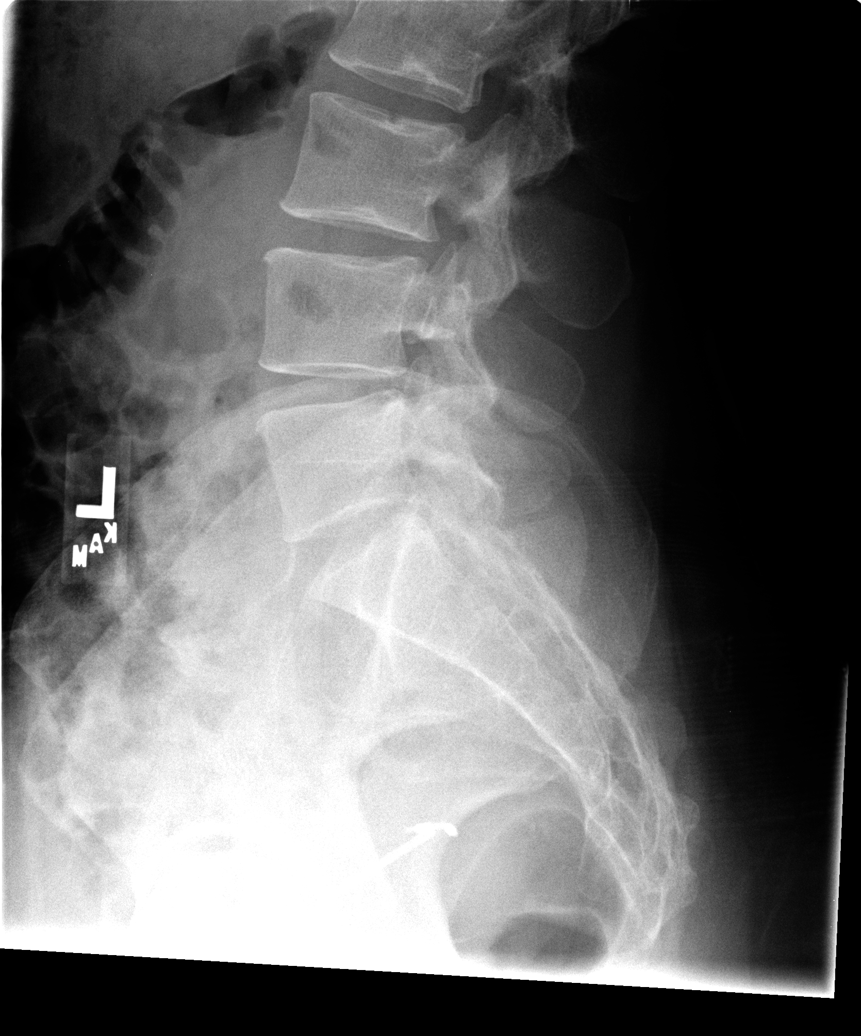

[5 of 5 positions shown; findings below may reference images not displayed]

FINDINGS: Alignment is anatomic.  Mild endplate degenerative
changes are seen throughout, including scattered Schmorl's nodes.
There may be mild posterior disc space narrowing at L5-S1.
Intrauterine contraceptive device is seen in the anatomic pelvis.
IMPRESSION: Mild spondylosis.  No acute findings.

## 2011-03-31 ENCOUNTER — Telehealth: Payer: Self-pay | Admitting: Family Medicine

## 2011-03-31 MED ORDER — FLUOXETINE HCL 20 MG PO TABS: 20.0000 mg | ORAL_TABLET | Freq: Every day | ORAL | Status: DC

## 2011-03-31 NOTE — Telephone Encounter (Signed)
Spoke with pt and on pts last visit Prozac was increased from 20 mg to 40 mg. Pt said after started taking Prozac 40 mg pt had night sweats, not sleeping,lower stomach ache and headache. Pt wants to go back to Prozac 20 mg. Pt uses [facility] [address] and can be reached at [phone]. Pt said she does not need call back today.

## 2011-03-31 NOTE — Telephone Encounter (Signed)
Left vm for pt to callback 

## 2011-03-31 NOTE — Telephone Encounter (Signed)
Will change back to 20  Let me know if no imp Will refill electronically

## 2011-03-31 NOTE — Telephone Encounter (Signed)
Requesting that her medication be changed back to previous dosage.  Please call back.

## 2011-04-01 NOTE — Telephone Encounter (Signed)
Left vm for pt to callback 

## 2011-04-01 NOTE — Telephone Encounter (Signed)
Patient notified as instructed by telephone. 

## 2011-10-08 ENCOUNTER — Ambulatory Visit: Payer: BC Managed Care – PPO | Admitting: Family Medicine

## 2011-12-22 ENCOUNTER — Telehealth: Payer: Self-pay

## 2011-12-22 NOTE — Telephone Encounter (Signed)
Pt request refills to [facility] for prozac. Pt to contact [facility]; [facility]

## 2012-01-12 ENCOUNTER — Ambulatory Visit: Payer: BC Managed Care – PPO | Admitting: Family Medicine

## 2012-01-17 ENCOUNTER — Ambulatory Visit (INDEPENDENT_AMBULATORY_CARE_PROVIDER_SITE_OTHER): Payer: BC Managed Care – PPO | Admitting: Family Medicine

## 2012-01-17 ENCOUNTER — Encounter: Payer: Self-pay | Admitting: Family Medicine

## 2012-01-17 VITALS — BP 112/84 | HR 80 | Temp 98.2°F | Ht 66.5 in | Wt 156.8 lb

## 2012-01-17 DIAGNOSIS — K589 Irritable bowel syndrome without diarrhea: Secondary | ICD-10-CM

## 2012-01-17 DIAGNOSIS — K2970 Gastritis, unspecified, without bleeding: Secondary | ICD-10-CM

## 2012-01-17 DIAGNOSIS — K219 Gastro-esophageal reflux disease without esophagitis: Secondary | ICD-10-CM

## 2012-01-17 DIAGNOSIS — Z23 Encounter for immunization: Secondary | ICD-10-CM

## 2012-01-17 DIAGNOSIS — F411 Generalized anxiety disorder: Secondary | ICD-10-CM

## 2012-01-17 MED ORDER — FLUOXETINE HCL 20 MG PO TABS: ORAL_TABLET | ORAL | Status: DC

## 2012-01-17 MED ORDER — LANSOPRAZOLE 30 MG PO CPDR: 30.0000 mg | DELAYED_RELEASE_CAPSULE | Freq: Every day | ORAL | Status: DC

## 2012-01-17 NOTE — Assessment & Plan Note (Signed)
Worse lately with increased stress Disc diet Inc prevacid to 30 mg F/u in about a mo or earlier if needed

## 2012-01-17 NOTE — Assessment & Plan Note (Signed)
More trouble lately with intermittent constipation/ diarrhea and gas/ bloating Will try citrucel otc Disc fluid intake Handout given F/u 1 mo  If not imp - will ref to GI She also has some symptoms of leg pain which occurs at the same time- if this does not resolve will address further as well

## 2012-01-17 NOTE — Progress Notes (Signed)
Subjective:    Patient ID: Beth Reynolds, female    DOB: [birth year], 41 y.o.   MRN: [id number]  HPI Here for GI problems Bloating and cramps - since the start of summer  (middle of abdomen- lots of bubbling), and bloating is her whole stomach Is using 3-4 gas ex pills per day Then she gets pain that radiates down the back of her legs - throbs when she lies down  The symptoms definitely come together  Some types of foods make it worse -- [place]ac[place]/ pineapple / chips and salsa (acidic foods)/ also beans and gassy foods  Also cannot drink OJ Has burning feeling - chest and abd  Takes prevacid every day- sometimes 2 at a time   Goes back and forth between constipation and diarrhea  No blood in stool or dark stool No vomiting, occ nausea   No fever   Stress 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 ANXIETY  . TOBACCO ABUSE  . DEPRESSION  . MIGRAINE  . ALLERGIC RHINITIS  . GERD  . MENOPAUSE, EARLY  . DISORDER, MENOPAUSAL NOS  . Routine general medical examination at a health care facility  . Hyperglycemia   Past Medical History  Diagnosis Date  . PMDD (premenstrual dysphoric disorder)   . Anxiety   . Allergic rhinoconjunctivitis   . Tobacco abuse   . Migraine   . Early menopause    Past Surgical History  Procedure Date  . Dilation and curettage of uterus   . Lesion removal     from finger   History  Substance Use Topics  . Smoking status: Current Every Day Smoker -- 0.5 packs/day  . Smokeless tobacco: Not on file  . Alcohol Use: No   Family History  Problem Relation Age of Onset  . Diabetes Father    Allergies  Allergen Reactions  . Paroxetine     REACTION: sever headache, not able to sleep and weight gain  . Venlafaxine     REACTION: HA   Current Outpatient Prescriptions on File Prior to Visit  Medication Sig Dispense Refill  . calcium  carbonate (TUMS - DOSED IN MG ELEMENTAL CALCIUM) 500 MG chewable tablet Chew 1 tablet by mouth as directed.        . cetirizine (ZYRTEC ALLERGY) 10 MG tablet Take 10 mg by mouth daily. In allergy season       . Fluoxetine HCl, PMDD, 20 MG CAPS Take 1 capsule by mouth daily.      . fluticasone (FLONASE) 50 MCG/ACT nasal spray Place 2 sprays into the nose daily as needed.        . lansoprazole (PREVACID) 15 MG capsule Take 15 mg by mouth daily.        . promethazine (PHENERGAN) 25 MG tablet Take 25 mg by mouth every 4 (four) hours as needed. nausea       . FLUoxetine (PROZAC) 20 MG tablet Take 1 tablet (20 mg total) by mouth daily.  30 tablet  11       Had to inc her fluoxetine tablets during her period to 11/2 pills - may benefit from it every day It is quite helpful Her GI symptoms preceeded this   Patient Active Problem List  Diagnosis  . ANXIETY  . TOBACCO ABUSE  . DEPRESSION  . MIGRAINE  . ALLERGIC RHINITIS  .  GERD  . MENOPAUSE, EARLY  . DISORDER, MENOPAUSAL NOS  . Routine general medical examination at a health care facility  . Hyperglycemia  . Gastritis  . IBS (irritable bowel syndrome)   Past Medical History  Diagnosis Date  . PMDD (premenstrual dysphoric disorder)   . Anxiety   . Allergic rhinoconjunctivitis   . Tobacco abuse   . Migraine   . Early menopause    Past Surgical History  Procedure Date  . Dilation and curettage of uterus   . Lesion removal     from finger   History  Substance Use Topics  . Smoking status: Current Every Day Smoker -- 0.5 packs/day  . Smokeless tobacco: Not on file  . Alcohol Use: No   Family History  Problem Relation Age of Onset  . Diabetes Father    Allergies  Allergen Reactions  . Paroxetine     REACTION: sever headache, not able to sleep and weight gain  . Venlafaxine     REACTION: HA   Current Outpatient Prescriptions on File Prior to Visit  Medication Sig Dispense Refill  . calcium carbonate (TUMS - DOSED IN MG  ELEMENTAL CALCIUM) 500 MG chewable tablet Chew 1 tablet by mouth as directed.        . cetirizine (ZYRTEC ALLERGY) 10 MG tablet Take 10 mg by mouth daily. In allergy season       . fluticasone (FLONASE) 50 MCG/ACT nasal spray Place 2 sprays into the nose daily as needed.        . promethazine (PHENERGAN) 25 MG tablet Take 25 mg by mouth every 4 (four) hours as needed. nausea       . FLUoxetine (PROZAC) 20 MG tablet Take 1 tablet (20 mg total) by mouth daily.  30 tablet  11        Review of Systems Review of Systems  Constitutional: Negative for fever, appetite change, fatigue and unexpected weight change.  Eyes: Negative for pain and visual disturbance.  Respiratory: Negative for cough and shortness of breath.   Cardiovascular: Negative for cp or palpitations    Gastrointestinal: Negative for nausea, vomiting, blood in stool or dark black stools Genitourinary: Negative for urgency and frequency.  Skin: Negative for pallor or rash   Neurological: Negative for weakness, light-headedness, numbness and headaches.  Hematological: Negative for adenopathy. Does not bruise/bleed easily.  Psychiatric/Behavioral: Negative for dysphoric mood. The patient is not nervous/anxious.         Objective:   Physical Exam  Constitutional: She appears well-developed and well-nourished. No distress.  HENT:  Head: Normocephalic and atraumatic.  Right Ear: External ear normal.  Left Ear: External ear normal.  Mouth/Throat: Oropharynx is clear and moist.  Eyes: Conjunctivae normal and EOM are normal. Pupils are equal, round, and reactive to light. No scleral icterus.  Neck: Normal range of motion. Neck supple. No JVD present. No thyromegaly present.  Cardiovascular: Normal rate, regular rhythm and normal heart sounds.   Pulmonary/Chest: Effort normal and breath sounds normal. No respiratory distress. She has no wheezes.  Abdominal: Soft. Bowel sounds are normal. She exhibits no distension and no mass.  There is no tenderness. There is no rebound and no guarding.  Musculoskeletal: Normal range of motion. She exhibits no edema and no tenderness.  Lymphadenopathy:    She has no cervical adenopathy.  Neurological: She is alert. She has normal reflexes. No cranial nerve deficit. She exhibits normal muscle tone. Coordination normal.  Skin: Skin is warm and dry. No  rash noted. No erythema. No pallor.  Psychiatric: She has a normal mood and affect.          Assessment & Plan:

## 2012-01-17 NOTE — Patient Instructions (Addendum)
I think you have too much acid in your stomach and that is making your reflux worse  Increase prevacid to 30 mg once daily  Also avoid spicy and high acid foods and caffeine Also you may have irritable bowel syndrome  Get some citrucel fiber supplement over the counter and take it every day with water as directed  Follow up with me in about a month - but call earlier if symptoms worsen Keep taking the prozac at 11/2 pills since that works better

## 2012-01-17 NOTE — Assessment & Plan Note (Signed)
With inc stress and gerd lately Disc diet  Inc prevacid to 30 mg daily F/u 1 mo or earlier if needed

## 2012-04-14 ENCOUNTER — Encounter: Payer: Self-pay | Admitting: Internal Medicine

## 2012-04-14 ENCOUNTER — Ambulatory Visit (INDEPENDENT_AMBULATORY_CARE_PROVIDER_SITE_OTHER): Payer: BC Managed Care – PPO | Admitting: Internal Medicine

## 2012-04-14 VITALS — BP 124/82 | HR 84 | Temp 98.2°F | Wt 164.2 lb

## 2012-04-14 DIAGNOSIS — J029 Acute pharyngitis, unspecified: Secondary | ICD-10-CM

## 2012-04-14 NOTE — Assessment & Plan Note (Signed)
Has some cervical nodes---but also cough, no sig fever and pharynx doesn't look bad. 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

## 2012-04-14 NOTE — Progress Notes (Signed)
  Subjective:    Patient ID: Beth Reynolds, female    DOB: [birth year], 42 y.o.   MRN: [id number]  HPI Having throat problems Pain on right side--and can feel a lump there Trouble with swallowing Started with right ear stuffiness 2 days ago Has nasal congestion as well  Some dry cough No sig nasal drainage Temp to 99 yesterday. 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 calcium carbonate (TUMS - DOSED IN MG ELEMENTAL CALCIUM) 500 MG chewable tablet Chew 1 tablet by mouth as directed.        . cetirizine (ZYRTEC ALLERGY) 10 MG tablet Take 10 mg by mouth daily. In allergy season       . FLUoxetine (PROZAC) 20 MG tablet Take 11/2 tablets by mouth once daily  45 tablet  5  . fluticasone (FLONASE) 50 MCG/ACT nasal spray Place 2 sprays into the nose daily as needed.        . lansoprazole (PREVACID) 30 MG capsule Take 1 capsule (30 mg total) by mouth daily.  30 capsule  11  . promethazine (PHENERGAN) 25 MG tablet Take 25 mg by mouth every 4 (four) hours as needed. nausea       . FLUoxetine (PROZAC) 20 MG tablet Take 1 tablet (20 mg total) by mouth daily.  30 tablet  11    Allergies  Allergen Reactions  . Paroxetine     REACTION: sever headache, not able to sleep and weight gain  . Venlafaxine     REACTION: HA    Past Medical History  Diagnosis Date  . PMDD (premenstrual dysphoric disorder)   . Anxiety   . Allergic rhinoconjunctivitis   . Tobacco abuse   . Migraine   . Early menopause     Past Surgical History  Procedure Date  . Dilation and curettage of uterus   . Lesion removal     from finger    Family History  Problem Relation Age of Onset  . Diabetes Father     History   Social History  . Marital Status: Married    Spouse Name: N/A    Number of Children: N/A  . Years of Education: N/A   Occupational  History  . Not on file.   Social History Main Topics  . Smoking status: Current Every Day Smoker -- 0.5 packs/day  . Smokeless tobacco: Not on file  . Alcohol Use: No  . Drug Use: No  . Sexually Active: Not on file   Other Topics Concern  . Not on file   Social History Narrative   Heavy caffiene intake   Review of Systems No rash No abdominal pain No vomiting or diarrhea     Objective:   Physical Exam  Constitutional: She appears well-developed and well-nourished. No distress.  HENT:       No sinus tenderness TMs are not inflamed (cerumen on right) Mild nasal inflammation Mild pharyngeal injection without tonsillar enlargement or exudates  Neck: Normal range of motion. Neck supple.       Mildly tender anterior cervical nodes bilaterally  Pulmonary/Chest: Effort normal and breath sounds normal. No respiratory distress. She has no wheezes. She has no rales.  Lymphadenopathy:    She has cervical adenopathy.  Skin: No pallor.          Assessment & Plan:

## 2012-05-29 ENCOUNTER — Ambulatory Visit: Payer: BC Managed Care – PPO | Admitting: Family Medicine

## 2012-09-21 ENCOUNTER — Other Ambulatory Visit: Payer: Self-pay | Admitting: Family Medicine

## 2012-09-21 MED ORDER — FLUOXETINE HCL 20 MG PO TABS: ORAL_TABLET | ORAL | Status: DC

## 2012-09-21 NOTE — Telephone Encounter (Signed)
Fax refill request, Dr. Milinda Antis out of office

## 2012-09-21 NOTE — Telephone Encounter (Signed)
Sent. Future refills per PCP.  Thanks.

## 2012-12-12 ENCOUNTER — Other Ambulatory Visit: Payer: Self-pay | Admitting: Family Medicine

## 2012-12-13 NOTE — Telephone Encounter (Signed)
Schedule f/u or PE (whichever pt pref) in winter and refill until then Thanks

## 2012-12-13 NOTE — Telephone Encounter (Signed)
Electronic refill request, please advise  

## 2012-12-14 NOTE — Telephone Encounter (Signed)
Left voicemail requesting pt to call office 

## 2012-12-15 NOTE — Telephone Encounter (Signed)
Pt left v/m cking on status of fluoxetine refill; request cb [phone].

## 2012-12-15 NOTE — Telephone Encounter (Signed)
[facility] left v/m requesting refill Fluoxetine.Please advise.

## 2012-12-15 NOTE — Telephone Encounter (Signed)
F/u scheduled and med refilled until then

## 2013-01-23 ENCOUNTER — Other Ambulatory Visit: Payer: Self-pay | Admitting: *Deleted

## 2013-01-23 MED ORDER — LANSOPRAZOLE 30 MG PO CPDR: 30.0000 mg | DELAYED_RELEASE_CAPSULE | Freq: Every day | ORAL | Status: DC

## 2013-02-19 ENCOUNTER — Ambulatory Visit: Payer: BC Managed Care – PPO | Admitting: Family Medicine

## 2013-02-19 DIAGNOSIS — Z0289 Encounter for other administrative examinations: Secondary | ICD-10-CM

## 2013-02-21 ENCOUNTER — Encounter: Payer: Self-pay | Admitting: Family Medicine

## 2013-02-21 ENCOUNTER — Ambulatory Visit (INDEPENDENT_AMBULATORY_CARE_PROVIDER_SITE_OTHER): Payer: BC Managed Care – PPO | Admitting: Family Medicine

## 2013-02-21 VITALS — BP 116/72 | HR 83 | Temp 98.3°F | Ht 66.75 in | Wt 155.5 lb

## 2013-02-21 DIAGNOSIS — J309 Allergic rhinitis, unspecified: Secondary | ICD-10-CM

## 2013-02-21 DIAGNOSIS — F17200 Nicotine dependence, unspecified, uncomplicated: Secondary | ICD-10-CM

## 2013-02-21 DIAGNOSIS — F329 Major depressive disorder, single episode, unspecified: Secondary | ICD-10-CM

## 2013-02-21 DIAGNOSIS — R7309 Other abnormal glucose: Secondary | ICD-10-CM

## 2013-02-21 DIAGNOSIS — F3289 Other specified depressive episodes: Secondary | ICD-10-CM

## 2013-02-21 DIAGNOSIS — R739 Hyperglycemia, unspecified: Secondary | ICD-10-CM

## 2013-02-21 MED ORDER — FLUOXETINE HCL 20 MG PO TABS: ORAL_TABLET | ORAL | Status: DC

## 2013-02-21 MED ORDER — LANSOPRAZOLE 30 MG PO CPDR: 30.0000 mg | DELAYED_RELEASE_CAPSULE | Freq: Every day | ORAL | Status: DC

## 2013-02-21 NOTE — Progress Notes (Signed)
Subjective:    Patient ID: Beth Reynolds, female    DOB: [birth year], 42 y.o.   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 Are still cycling)  Last pap was in 2012   Walking for exercise- fitness group at school  Also has a grandchild - 22 months old   Allergies - uses flonase just in season   Takes pravacid generic every day  No problems when she is oin that   Does not want the flu shot  It gives her extreme pain down the shoulder blade - after the shot  She does not want to do before the holiday  Patient Active Problem List   Diagnosis Date Noted  . Sore throat 04/14/2012  . Gastritis 01/17/2012  . IBS (irritable bowel syndrome) 01/17/2012  . Hyperglycemia 10/09/2010  . Routine general medical examination at a health care facility 10/04/2010  . MENOPAUSE, EARLY 11/03/2009  . DISORDER, MENOPAUSAL NOS 10/11/2009  . MIGRAINE 01/19/2008  . ANXIETY 07/17/2007  . TOBACCO ABUSE 07/17/2007  . DEPRESSION 07/17/2007  . ALLERGIC RHINITIS 07/17/2007  . GERD 07/17/2007   Past Medical History  Diagnosis Date  . PMDD (premenstrual dysphoric disorder)   . Anxiety   . Allergic rhinoconjunctivitis   . Tobacco abuse   . Migraine   . Early menopause    Past Surgical History  Procedure Laterality Date  . Dilation and curettage of uterus    . Lesion removal      from finger   History  Substance Use Topics  . Smoking status: Current Every Day Smoker -- 0.50 packs/day  . Smokeless tobacco: Not on file  . Alcohol Use: No   Family History  Problem Relation Age of Onset  . Diabetes Father    Allergies  Allergen Reactions  . Paroxetine     REACTION: sever  headache, not able to sleep and weight gain  . Venlafaxine     REACTION: HA   Current Outpatient Prescriptions on File Prior to Visit  Medication Sig Dispense Refill  . calcium carbonate (TUMS - DOSED IN MG ELEMENTAL CALCIUM) 500 MG chewable tablet Chew 1 tablet by mouth as directed.        . cetirizine (ZYRTEC ALLERGY) 10 MG tablet Take 10 mg by mouth daily. In allergy season       . fluticasone (FLONASE) 50 MCG/ACT nasal spray Place 2 sprays into the nose daily as needed.        . promethazine (PHENERGAN) 25 MG tablet Take 25 mg by mouth every 4 (four) hours as needed. nausea        No current facility-administered medications on file prior to visit.      Review of Systems Review of Systems  Constitutional: Negative for fever, appetite change, fatigue and unexpected weight change.  Eyes: Negative for pain and visual disturbance.  Respiratory: Negative for cough and shortness of breath.   Cardiovascular: Negative for cp or palpitations    Gastrointestinal: Negative for nausea, diarrhea and constipation.  Genitourinary: Negative for urgency and frequency.  Skin: Negative for pallor or  rash   Neurological: Negative for weakness, light-headedness, numbness and headaches.  Hematological: Negative for adenopathy. Does not bruise/bleed easily.  Psychiatric/Behavioral: Negative for dysphoric mood. The patient is not nervous/anxious.         Objective:   Physical Exam  Constitutional: She appears well-developed and well-nourished. No distress.  HENT:  Head: Normocephalic and atraumatic.  Right Ear: External ear normal.  Left Ear: External ear normal.  Nose: Nose normal.  Mouth/Throat: Oropharynx is clear and moist.  Eyes: Conjunctivae and EOM are normal. Pupils are equal, round, and reactive to light. Right eye exhibits no discharge. Left eye exhibits no discharge. No scleral icterus.  Neck: Normal range of motion. Neck supple. No JVD present. No thyromegaly present.    Cardiovascular: Normal rate, regular rhythm and normal heart sounds.  Exam reveals no gallop.   Pulmonary/Chest: Effort normal and breath sounds normal. No respiratory distress. She has no wheezes. She has no rales.  Musculoskeletal: She exhibits no edema.  Lymphadenopathy:    She has no cervical adenopathy.  Neurological: She is alert. She has normal reflexes. No cranial nerve deficit. She exhibits normal muscle tone. Coordination normal.  Skin: Skin is warm and dry. No rash noted. No erythema.  Psychiatric: She has a normal mood and affect.  cheerful          Assessment & Plan:

## 2013-02-21 NOTE — Progress Notes (Signed)
Pre-visit discussion using our clinic review tool. No additional management support is needed unless otherwise documented below in the visit note.  

## 2013-02-21 NOTE — Patient Instructions (Signed)
I sent px to the pharmacy Please schedule PE for June with labs prior  Don't forget to schedule a mammogram  Take care of yourself  Think about quitting smoking

## 2013-02-22 NOTE — Assessment & Plan Note (Signed)
Disc in detail risks of smoking and possible outcomes including copd, vascular/ heart disease, cancer , respiratory and sinus infections  Pt voices understanding She is not ready to quit  

## 2013-02-22 NOTE — Assessment & Plan Note (Signed)
Lab Results  Component Value Date   HGBA1C 6.1 01/08/2011    Will re check for health mt exam Pt is limmiting sugar in diet

## 2013-02-22 NOTE — Assessment & Plan Note (Signed)
Refilled prozac Pt still feels she needs to continue it Health mt exam scheduled

## 2013-02-22 NOTE — Assessment & Plan Note (Signed)
Refill of flonase for use in season

## 2014-03-09 ENCOUNTER — Telehealth: Payer: Self-pay | Admitting: Family Medicine

## 2014-03-12 NOTE — Telephone Encounter (Signed)
Electronic refill request, pt hasn't been seen in over a yr. and no future appt., please advise

## 2014-03-12 NOTE — Telephone Encounter (Signed)
Please schedule a f/u appt and refill until then 

## 2014-03-13 NOTE — Telephone Encounter (Signed)
I tried to call patient and phone number was wrong.  I erased the phone number from the demographics.

## 2014-03-13 NOTE — Telephone Encounter (Signed)
Tried calling pt  Phone number incorrect

## 2014-03-13 NOTE — Telephone Encounter (Signed)
See prev note. Please schedule f/u appt., once appt is made I will refill Rx

## 2014-03-13 NOTE — Telephone Encounter (Signed)
Rx declined and I advise pharmacy pt needs to contact the office and scheduled appt

## 2014-03-18 ENCOUNTER — Ambulatory Visit (INDEPENDENT_AMBULATORY_CARE_PROVIDER_SITE_OTHER): Payer: BC Managed Care – PPO | Admitting: Family Medicine

## 2014-03-18 ENCOUNTER — Telehealth: Payer: Self-pay | Admitting: Family Medicine

## 2014-03-18 ENCOUNTER — Encounter: Payer: Self-pay | Admitting: Family Medicine

## 2014-03-18 VITALS — BP 118/78 | HR 82 | Temp 98.2°F | Ht 66.75 in | Wt 155.5 lb

## 2014-03-18 DIAGNOSIS — F17200 Nicotine dependence, unspecified, uncomplicated: Secondary | ICD-10-CM

## 2014-03-18 DIAGNOSIS — K219 Gastro-esophageal reflux disease without esophagitis: Secondary | ICD-10-CM

## 2014-03-18 DIAGNOSIS — Z720 Tobacco use: Secondary | ICD-10-CM

## 2014-03-18 DIAGNOSIS — R21 Rash and other nonspecific skin eruption: Secondary | ICD-10-CM | POA: Insufficient documentation

## 2014-03-18 DIAGNOSIS — F4323 Adjustment disorder with mixed anxiety and depressed mood: Secondary | ICD-10-CM

## 2014-03-18 MED ORDER — FLUOXETINE HCL 20 MG PO TABS: ORAL_TABLET | ORAL | Status: DC

## 2014-03-18 MED ORDER — MOMETASONE FUROATE 0.1 % EX CREA: 1.0000 "application " | TOPICAL_CREAM | Freq: Every day | CUTANEOUS | Status: DC | PRN

## 2014-03-18 MED ORDER — LANSOPRAZOLE 30 MG PO CPDR: 30.0000 mg | DELAYED_RELEASE_CAPSULE | Freq: Every day | ORAL | Status: DC

## 2014-03-18 NOTE — Telephone Encounter (Signed)
emmi emailed °

## 2014-03-18 NOTE — Assessment & Plan Note (Signed)
Doing well with prevacid  Enc good diet  Will refill this

## 2014-03-18 NOTE — Progress Notes (Signed)
Pre visit review using our clinic review tool, if applicable. No additional management support is needed unless otherwise documented below in the visit note. 

## 2014-03-18 NOTE — Patient Instructions (Addendum)
Keep hand moisturized  Get gloves for dishwashing  Avoid harsh detergents and hot water Use the elocon cream daily as needed  Moisturize frequently  (avoid colors and fragrances)  Update me if no improvement    Stop at check out for referral to counselor

## 2014-03-18 NOTE — Assessment & Plan Note (Signed)
Currently with some papules/scabs and dry skin  Suspect dyshidrotic eczema  tx with elocon cream prn  Regular moisturizer   Disc what to avoid Disc symptomatic care - see instructions on AVS

## 2014-03-18 NOTE — Assessment & Plan Note (Signed)
Reviewed stressors/ coping techniques/symptoms/ support sources/ tx options and side effects in detail today More issues lately - grief after loosing father  Continue prozac  Ref to counselor for further help  Pt talks freely about stressors

## 2014-03-18 NOTE — Assessment & Plan Note (Signed)
Disc in detail risks of smoking and possible outcomes including copd, vascular/ heart disease, cancer , respiratory and sinus infections  Pt voices understanding  Not ready to quit in light of recent stress/grief/mood issues Urged to keep thinking about it

## 2014-03-18 NOTE — Progress Notes (Signed)
Subjective:    Patient ID: Beth Reynolds, female    DOB: [birth year], 43 y.o.   MRN: [id number]  HPI Here for visit for med refills   Also a skin problem on her hands "more like sores than a rash"  On and off since the summer  Starts as "water bubbles" then itch and then she scratches  Also dry skin on hands Does wash dishes without gloves   occ happens on feet a bit   No household symptoms   Mood disorder -with dep and anxiety  A lot of stress this past year (family related stress) [place] been to [place] - her father passed away in Oct -- has a lot of guilt  Her whole family lives there  Things are starting to calm down  (she is exhausted) - trying to work also  prozac helps   She is not getting grief counseling / thinks counseling would be a good idea   Takes prevacid for gerd  This still helps as long as she takes it   Patient Active Problem List   Diagnosis Date Noted  . Sore throat 04/14/2012  . Gastritis 01/17/2012  . IBS (irritable bowel syndrome) 01/17/2012  . Hyperglycemia 10/09/2010  . Routine general medical examination at a health care facility 10/04/2010  . MENOPAUSE, EARLY 11/03/2009  . DISORDER, MENOPAUSAL NOS 10/11/2009  . MIGRAINE 01/19/2008  . ANXIETY 07/17/2007  . TOBACCO ABUSE 07/17/2007  . Adjustment disorder with mixed anxiety and depressed mood 07/17/2007  . ALLERGIC RHINITIS 07/17/2007  . GERD 07/17/2007   Past Medical History  Diagnosis Date  . PMDD (premenstrual dysphoric disorder)   . Anxiety   . Allergic rhinoconjunctivitis   . Tobacco abuse   . Migraine   . Early menopause    Past Surgical History  Procedure Laterality Date  . Dilation and curettage of uterus    . Lesion removal      from finger   History  Substance Use Topics  . Smoking status: Current Every Day Smoker -- 0.50 packs/day  . Smokeless tobacco: Not on file  . Alcohol Use: No   Family History  Problem Relation Age of Onset  . Diabetes Father     Allergies  Allergen Reactions  . Paroxetine     REACTION: sever headache, not able to sleep and weight gain  . Venlafaxine     REACTION: HA   Current Outpatient Prescriptions on File Prior to Visit  Medication Sig Dispense Refill  . calcium carbonate (TUMS - DOSED IN MG ELEMENTAL CALCIUM) 500 MG chewable tablet Chew 1 tablet by mouth as directed.      . cetirizine (ZYRTEC ALLERGY) 10 MG tablet Take 10 mg by mouth daily. In allergy season     . FLUoxetine (PROZAC) 20 MG tablet Take 1 pill by mouth daily 30 tablet 11  . fluticasone (FLONASE) 50 MCG/ACT nasal spray Place 2 sprays into the nose daily as needed.      . lansoprazole (PREVACID) 30 MG capsule Take 1 capsule (30 mg total) by mouth daily. 30 capsule 11  . promethazine (PHENERGAN) 25 MG tablet Take 25 mg by mouth every 4 (four) hours as needed. nausea      No current facility-administered medications on file prior to visit.      Review of Systems Review of Systems  Constitutional: Negative for fever, appetite change,  and unexpected weight change. pos for fatigue  Eyes: Negative for pain and visual disturbance.  Respiratory: Negative for  cough and shortness of breath.   Cardiovascular: Negative for cp or palpitations    Gastrointestinal: Negative for nausea, diarrhea and constipation. neg for gerd symptoms when taking prevacid  Genitourinary: Negative for urgency and frequency.  Skin: Negative for pallor and pos for rash on hands and feet that itches    Neurological: Negative for weakness, light-headedness, numbness and headaches.  Hematological: Negative for adenopathy. Does not bruise/bleed easily.  Psychiatric/Behavioral: Negative for dysphoric mood. The patient is anxious pos for stressors and grief         Objective:   Physical Exam  Constitutional: She appears well-developed and well-nourished. No distress.  HENT:  Head: Normocephalic and atraumatic.  Mouth/Throat: Oropharynx is clear and moist.  Eyes:  Conjunctivae and EOM are normal. Pupils are equal, round, and reactive to light. No scleral icterus.  Neck: Normal range of motion. Neck supple. No JVD present. Carotid bruit is not present. No thyromegaly present.  Cardiovascular: Normal rate, regular rhythm, normal heart sounds and intact distal pulses.  Exam reveals no gallop.   Pulmonary/Chest: Effort normal and breath sounds normal. No respiratory distress. She has no wheezes. She has no rales.  Diffusely distant bs  No wheeze   Abdominal: Soft. Bowel sounds are normal. She exhibits no distension and no mass. There is no tenderness.  Musculoskeletal: She exhibits no edema.  Lymphadenopathy:    She has no cervical adenopathy.  Neurological: She is alert. She has normal reflexes. No cranial nerve deficit. She exhibits normal muscle tone. Coordination normal.  Skin: Skin is warm and dry. No rash noted. No erythema. No pallor.  Scabs of different sizes and ages on dorsal hands and feet - none app infected  Few papules Dry skin w/o cracking     Psychiatric: Her speech is normal and behavior is normal. Thought content normal. Her affect is blunt.  Affect is mildly blunted today Pt seems fatigued           Assessment & Plan:   Problem List Items Addressed This Visit      Digestive   GERD    Doing well with prevacid  Enc good diet  Will refill this      Relevant Medications      lansoprazole (PREVACID) capsule     Musculoskeletal and Integument   Rash of hands - Primary    Currently with some papules/scabs and dry skin  Suspect dyshidrotic eczema  tx with elocon cream prn  Regular moisturizer   Disc what to avoid Disc symptomatic care - see instructions on AVS       Other   Adjustment disorder with mixed anxiety and depressed mood    Reviewed stressors/ coping techniques/symptoms/ support sources/ tx options and side effects in detail today More issues lately - grief after loosing father  Continue prozac  Ref to  counselor for further help  Pt talks freely about stressors     Relevant Orders      Ambulatory referral to Psychology   TOBACCO ABUSE    Disc in detail risks of smoking and possible outcomes including copd, vascular/ heart disease, cancer , respiratory and sinus infections  Pt voices understanding  Not ready to quit in light of recent stress/grief/mood issues Urged to keep thinking about it

## 2014-04-18 ENCOUNTER — Ambulatory Visit: Payer: BC Managed Care – PPO | Admitting: Psychology

## 2014-05-02 ENCOUNTER — Ambulatory Visit: Payer: BC Managed Care – PPO | Admitting: Psychology

## 2014-06-14 ENCOUNTER — Ambulatory Visit (INDEPENDENT_AMBULATORY_CARE_PROVIDER_SITE_OTHER): Payer: BC Managed Care – PPO | Admitting: Family Medicine

## 2014-06-14 ENCOUNTER — Encounter: Payer: Self-pay | Admitting: Family Medicine

## 2014-06-14 VITALS — BP 118/80 | HR 77 | Temp 98.1°F | Wt 154.0 lb

## 2014-06-14 DIAGNOSIS — G43709 Chronic migraine without aura, not intractable, without status migrainosus: Secondary | ICD-10-CM

## 2014-06-14 DIAGNOSIS — F4323 Adjustment disorder with mixed anxiety and depressed mood: Secondary | ICD-10-CM

## 2014-06-14 MED ORDER — GABAPENTIN 100 MG PO CAPS: 300.0000 mg | ORAL_CAPSULE | Freq: Every day | ORAL | Status: DC

## 2014-06-14 NOTE — Progress Notes (Signed)
Subjective:    Patient ID: Beth Reynolds, female    DOB: 02-19-1971, 44 y.o.   MRN: [id number]  HPI Here for headaches (hx of migraines) They make her nauseated  They radiate down to her jaw on the L side  Throbbing in both temples -worse on the L   She does not have teeth in the bottom ? 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 If that plays a roles Also has allergies  Also drinks caffeine - 3 cups of coffee in am  ? If she snores  Has trouble sleeping - falls asleep at 1 and then wakes up at 3   Takes ibuprofen 600 mg - helps a little   Her headaches usually do not get this bad   Stress/ grief problem getting worse  Sometimes feels numb all over  Aches all over   Is not seeing a counselor - she was referred but she has no days left  For her first visit she is seen in the am     Patient Active Problem List   Diagnosis Date Noted  . Rash of hands 03/18/2014  . Gastritis 01/17/2012  . IBS (irritable bowel syndrome) 01/17/2012  . Hyperglycemia 10/09/2010  . Routine general medical examination at a health care facility 10/04/2010  . MENOPAUSE, EARLY 11/03/2009  . DISORDER, MENOPAUSAL NOS 10/11/2009  . MIGRAINE 01/19/2008  . TOBACCO ABUSE 07/17/2007  . Adjustment disorder with mixed anxiety and depressed mood 07/17/2007  . ALLERGIC RHINITIS 07/17/2007  . GERD 07/17/2007   Past Medical History  Diagnosis Date  . PMDD (premenstrual dysphoric disorder)   . Anxiety   . Allergic rhinoconjunctivitis   . Tobacco abuse   . Migraine   . Early menopause    Past Surgical History  Procedure Laterality Date  . Dilation and curettage of uterus    . Lesion removal      from finger   History  Substance Use Topics  . Smoking status: Current Every Day Smoker -- 0.50 packs/day  .  Smokeless tobacco: Not on file  . Alcohol Use: No   Family History  Problem Relation Age of Onset  . Diabetes Father    Allergies  Allergen Reactions  . Paroxetine     REACTION: sever headache, not able to sleep and weight gain  . Venlafaxine     REACTION: HA   Current Outpatient Prescriptions on File Prior to Visit  Medication Sig Dispense Refill  . calcium carbonate (TUMS - DOSED IN MG ELEMENTAL CALCIUM) 500 MG chewable tablet Chew 1 tablet by mouth as directed.      . cetirizine (ZYRTEC ALLERGY) 10 MG tablet Take 10 mg by mouth daily. In allergy season     . FLUoxetine (PROZAC) 20 MG tablet Take 1 pill by mouth daily 30 tablet 11  . fluticasone (FLONASE) 50 MCG/ACT nasal spray Place 2 sprays into the nose daily as needed.      . lansoprazole (PREVACID) 30 MG capsule Take 1 capsule (30 mg total) by mouth daily. 30 capsule 11  . mometasone (ELOCON) 0.1 % cream Apply 1 application topically daily as needed. To affected areas on hands and feet 45 g 1  . promethazine (PHENERGAN) 25 MG tablet Take 25  mg by mouth every 4 (four) hours as needed. nausea      No current facility-administered medications on file prior to visit.   Review of Systems Review of Systems  Constitutional: Negative for fever, appetite change,  and unexpected weight change. pos for fatigue and poor sleep  Eyes: Negative for pain and visual disturbance.  ENT pos for dental pain with bad tooth L upper molar  Respiratory: Negative for cough and shortness of breath.   Cardiovascular: Negative for cp or palpitations    Gastrointestinal: Negative for nausea, diarrhea and constipation.  Genitourinary: Negative for urgency and frequency.  Skin: Negative for pallor or rash   Neurological: Negative for weakness, light-headedness, numbness and pos  headaches.  Hematological: Negative for adenopathy. Does not bruise/bleed easily.  Psychiatric/Behavioral: pos for dysphoric mood and anxiety , neg for SI       Objective:     Physical Exam  Constitutional: She is oriented to person, place, and time. She appears well-developed and well-nourished. No distress.  HENT:  Head: Normocephalic and atraumatic.  Right Ear: External ear normal.  Left Ear: External ear normal.  Nose: Nose normal.  Mouth/Throat: Oropharynx is clear and moist. No oropharyngeal exudate.  No sinus tenderness No temporal tenderness  No TMJ tenderness  Eyes: Conjunctivae and EOM are normal. Pupils are equal, round, and reactive to light. Right eye exhibits no discharge. Left eye exhibits no discharge. No scleral icterus.  No nystagmus  Neck: Normal range of motion and full passive range of motion without pain. Neck supple. No JVD present. Carotid bruit is not present. No tracheal deviation present. No thyromegaly present.  Cardiovascular: Normal rate, regular rhythm and normal heart sounds.   No murmur heard. Pulmonary/Chest: Effort normal and breath sounds normal. No respiratory distress. She has no wheezes. She has no rales.  Abdominal: Soft. Bowel sounds are normal. She exhibits no distension and no mass. There is no tenderness.  Musculoskeletal: She exhibits no edema or tenderness.  Lymphadenopathy:    She has no cervical adenopathy.  Neurological: She is alert and oriented to person, place, and time. She has normal strength and normal reflexes. She displays no atrophy and no tremor. No cranial nerve deficit or sensory deficit. She exhibits normal muscle tone. She displays a negative Romberg sign. Coordination and gait normal.  No focal cerebellar signs   Skin: Skin is warm and dry. No rash noted. No pallor.  Psychiatric: Her speech is normal and behavior is normal. Thought content normal. Her mood appears anxious. Her affect is blunt. Her affect is not labile and not inappropriate. Thought content is not paranoid. Cognition and memory are normal. She exhibits a depressed mood. She expresses no homicidal and no suicidal ideation.  Pt seems  generally down and very fatigued           Assessment & Plan:   Problem List Items Addressed This Visit      Cardiovascular and Mediastinum   Migraine - Primary    Without aura  Suspect multifactorial  Disc triggers incl stress/allergies/grief/lack of sleep/caffeine/ dental problems   Will attempt ref again to counseling  Disc allergy symptom tx  Trial of gabapentin at night for sleep and headaches - gradual inc as tolerated Strongly advised to get to dentist for the tooth on L side- making headaches worse and also at risk for infection  She will work on this   Disc lifestyle change for migraine as well   >25 minutes spent in face to face  time with patient, >50% spent in counselling or coordination of care       Relevant Medications   gabapentin (NEURONTIN) capsule     Other   Adjustment disorder with mixed anxiety and depressed mood    With stressors Was ref to counseling last time- had scheduling issues and we will try that again  Will see if we can get am or later pm appt  Stressed imp of this  Still dealing with grief  Reviewed stressors/ coping techniques/symptoms/ support sources/ tx options and side effects in detail today

## 2014-06-14 NOTE — Progress Notes (Signed)
Pre visit review using our clinic review tool, if applicable. No additional management support is needed unless otherwise documented below in the visit note. 

## 2014-06-14 NOTE — Patient Instructions (Signed)
Try gabapentin at night - start with 100 mg and then increase to 300 mg depending on how you feel with it   (you can increase it by 100 per week) Gradually cut caffeine  Get to the dentist  Drink more water  Hopefully we can get some sleep   Talk to the ladies on the way out about trying to get a counseling appt that agrees with with your schedule

## 2014-06-16 NOTE — Assessment & Plan Note (Signed)
Without aura  Suspect multifactorial  Disc triggers incl stress/allergies/grief/lack of sleep/caffeine/ dental problems   Will attempt ref again to counseling  Disc allergy symptom tx  Trial of gabapentin at night for sleep and headaches - gradual inc as tolerated Strongly advised to get to dentist for the tooth on L side- making headaches worse and also at risk for infection  She will work on this   Disc lifestyle change for migraine as well   >25 minutes spent in face to face time with patient, >50% spent in counselling or coordination of care

## 2014-06-16 NOTE — Assessment & Plan Note (Signed)
With stressors Was ref to counseling last time- had scheduling issues and we will try that again  Will see if we can get am or later pm appt  Stressed imp of this  Still dealing with grief  Reviewed stressors/ coping techniques/symptoms/ support sources/ tx options and side effects in detail today

## 2014-06-27 ENCOUNTER — Ambulatory Visit (INDEPENDENT_AMBULATORY_CARE_PROVIDER_SITE_OTHER): Payer: BC Managed Care – PPO | Admitting: Psychology

## 2014-06-27 DIAGNOSIS — F4323 Adjustment disorder with mixed anxiety and depressed mood: Secondary | ICD-10-CM

## 2014-07-18 ENCOUNTER — Ambulatory Visit: Payer: BC Managed Care – PPO | Admitting: Psychology

## 2014-08-14 ENCOUNTER — Ambulatory Visit: Payer: BC Managed Care – PPO | Admitting: Family Medicine

## 2014-09-27 LAB — HM PAP SMEAR: HM Pap smear: NORMAL

## 2014-11-11 ENCOUNTER — Ambulatory Visit
Admission: RE | Admit: 2014-11-11 | Discharge: 2014-11-11 | Disposition: A | Discharge status: Home / Self Care | Payer: No Typology Code available for payment source | Source: Ambulatory Visit | Attending: Infectious Disease | Admitting: Infectious Disease

## 2014-11-11 ENCOUNTER — Other Ambulatory Visit: Payer: Self-pay | Admitting: Infectious Disease

## 2014-11-11 DIAGNOSIS — Z111 Encounter for screening for respiratory tuberculosis: Secondary | ICD-10-CM

## 2015-03-20 ENCOUNTER — Other Ambulatory Visit: Payer: Self-pay | Admitting: Family Medicine

## 2015-03-20 NOTE — Telephone Encounter (Signed)
Electronic refill request, pt hasn't been seen since March 2016 and no recent appt., please advise

## 2015-03-21 NOTE — Telephone Encounter (Signed)
Please schedule f/u in the spring and refill until then  Thanks  

## 2015-03-21 NOTE — Telephone Encounter (Signed)
Called pt and no answer and no voicemail set up 

## 2015-03-25 ENCOUNTER — Other Ambulatory Visit: Payer: Self-pay | Admitting: Family Medicine

## 2015-03-25 NOTE — Telephone Encounter (Signed)
appt scheduled and med refilled 

## 2015-04-30 ENCOUNTER — Other Ambulatory Visit: Payer: Self-pay | Admitting: Family Medicine

## 2015-06-11 ENCOUNTER — Ambulatory Visit (INDEPENDENT_AMBULATORY_CARE_PROVIDER_SITE_OTHER): Payer: BC Managed Care – PPO | Admitting: Family Medicine

## 2015-06-11 ENCOUNTER — Encounter: Payer: Self-pay | Admitting: Family Medicine

## 2015-06-11 VITALS — BP 118/66 | HR 79 | Temp 98.4°F | Ht 66.75 in | Wt 163.5 lb

## 2015-06-11 DIAGNOSIS — K219 Gastro-esophageal reflux disease without esophagitis: Secondary | ICD-10-CM

## 2015-06-11 DIAGNOSIS — N959 Unspecified menopausal and perimenopausal disorder: Secondary | ICD-10-CM

## 2015-06-11 DIAGNOSIS — F17200 Nicotine dependence, unspecified, uncomplicated: Secondary | ICD-10-CM

## 2015-06-11 DIAGNOSIS — F4323 Adjustment disorder with mixed anxiety and depressed mood: Secondary | ICD-10-CM | POA: Diagnosis not present

## 2015-06-11 DIAGNOSIS — R21 Rash and other nonspecific skin eruption: Secondary | ICD-10-CM

## 2015-06-11 MED ORDER — MOMETASONE FUROATE 0.1 % EX CREA: TOPICAL_CREAM | CUTANEOUS | Status: DC

## 2015-06-11 MED ORDER — FLUOXETINE HCL 20 MG PO TABS: 20.0000 mg | ORAL_TABLET | Freq: Every day | ORAL | Status: DC

## 2015-06-11 MED ORDER — LANSOPRAZOLE 30 MG PO CPDR: DELAYED_RELEASE_CAPSULE | ORAL | Status: DC

## 2015-06-11 NOTE — Progress Notes (Signed)
Pre visit review using our clinic review tool, if applicable. No additional management support is needed unless otherwise documented below in the visit note. 

## 2015-06-11 NOTE — Patient Instructions (Signed)
Keep thinking about quitting smoking  Take care of yourself  Don't forget to get a flu shot next season  No change in medicine  Try your best to incorporate some exercise and eat less junk /more healthy options

## 2015-06-11 NOTE — Progress Notes (Signed)
Subjective:    Patient ID: Beth Reynolds, female    DOB: 15-Sep-1970, 45 y.o.   MRN: [id number]  HPI Here for f/u of chronic medical problems   Working as usual Feeling about the same  Stress level is the same as usual when school is in   Menopause-still suffering through  Wt gain 9 lb with bmi of 25  Taking prozac for irritability  Not helping for the hot flashes   No period in since age 13   Not eating healthy  Not getting enough exercise  Working long hours -not a lot of time for self care   Still a smoking  1/2 ppd  Not ready to quit yet   Still taking prevacid and acid reflux is under control  More IBS symptoms lately - since menopause   She just had the flu  Did not get a flu shot this year followed by sinusitis and bronchitis -was treated  Plans to get regular flu shots after that   Patient Active Problem List   Diagnosis Date Noted  . Rash of hands 03/18/2014  . Gastritis 01/17/2012  . IBS (irritable bowel syndrome) 01/17/2012  . Hyperglycemia 10/09/2010  . Routine general medical examination at a health care facility 10/04/2010  . MENOPAUSE, EARLY 11/03/2009  . DISORDER, MENOPAUSAL NOS 10/11/2009  . Migraine 01/19/2008  . TOBACCO ABUSE 07/17/2007  . Adjustment disorder with mixed anxiety and depressed mood 07/17/2007  . ALLERGIC RHINITIS 07/17/2007  . GERD 07/17/2007   Past Medical History  Diagnosis Date  . PMDD (premenstrual dysphoric disorder)   . Anxiety   . Allergic rhinoconjunctivitis   . Tobacco abuse   . Migraine   . Early menopause    Past Surgical History  Procedure Laterality Date  . Dilation and curettage of uterus    . Lesion removal      from finger   Social History  Substance Use Topics  . Smoking status: Current Every Day Smoker -- 0.50 packs/day  . Smokeless tobacco: None  . Alcohol Use: No   Family History  Problem Relation Age of Onset  . Diabetes Father    Allergies  Allergen Reactions  . Paroxetine    REACTION: sever headache, not able to sleep and weight gain  . Venlafaxine     REACTION: HA   Current Outpatient Prescriptions on File Prior to Visit  Medication Sig Dispense Refill  . calcium carbonate (TUMS - DOSED IN MG ELEMENTAL CALCIUM) 500 MG chewable tablet Chew 1 tablet by mouth as directed.      . cetirizine (ZYRTEC ALLERGY) 10 MG tablet Take 10 mg by mouth daily. In allergy season     . FLUoxetine (PROZAC) 20 MG tablet TAKE 1 TABLET BY MOUTH DAILY 30 tablet 2  . lansoprazole (PREVACID) 30 MG capsule TAKE 1 CAPSULE (30 MG TOTAL) BY MOUTH DAILY. 30 capsule 2  . mometasone (ELOCON) 0.1 % cream APPLY TO AFFECTED AREA AS NEEDED (ON HANDS AND FEET) 45 g 0   No current facility-administered medications on file prior to visit.    Review of Systems Review of Systems  Constitutional: Negative for fever, appetite change, fatigue and unexpected weight change.  Eyes: Negative for pain and visual disturbance.  Respiratory: Negative for cough and shortness of breath.   Cardiovascular: Negative for cp or palpitations    Gastrointestinal: Negative for nausea, diarrhea and constipation. neg for heartburn unless she forgets her medicine  Genitourinary: Negative for urgency and frequency. pos for hot flashes  and night sweats  Skin: Negative for pallor or rash  pos for patches of dry skin on hands and extremities  Neurological: Negative for weakness, light-headedness, numbness and headaches.  Hematological: Negative for adenopathy. Does not bruise/bleed easily.  Psychiatric/Behavioral: pos for intermittent mood change/irritabiity and anxiety- improved with prozac, pos for stressors          Objective:   Physical Exam  Constitutional: She appears well-developed and well-nourished. No distress.  Well appearing   HENT:  Head: Normocephalic and atraumatic.  Mouth/Throat: Oropharynx is clear and moist.  Nares are boggy  Eyes: Conjunctivae and EOM are normal. Pupils are equal, round, and reactive  to light.  Neck: Normal range of motion. Neck supple. No JVD present. Carotid bruit is not present. No thyromegaly present.  Cardiovascular: Normal rate, regular rhythm, normal heart sounds and intact distal pulses.  Exam reveals no gallop.   Pulmonary/Chest: Effort normal and breath sounds normal. No respiratory distress. She has no wheezes. She has no rales.  No crackles  Diffusely distant bs   Abdominal: Soft. Bowel sounds are normal. She exhibits no distension, no abdominal bruit and no mass. There is no tenderness. There is no rebound and no guarding.  Musculoskeletal: She exhibits no edema.  Lymphadenopathy:    She has no cervical adenopathy.  Neurological: She is alert. She has normal reflexes.  Skin: Skin is warm and dry. No pallor.  Patches of dry skin on hands and legs without breakdown or erythema   Psychiatric: She has a normal mood and affect. Her speech is normal and behavior is normal. Thought content normal. Her mood appears not anxious. Her affect is not blunt and not labile. She does not exhibit a depressed mood.  Pleasant and talkative           Assessment & Plan:   Problem List Items Addressed This Visit      Digestive   GERD    Will refill prevacid -no breakthrough symptoms  Disc diet -pt is enc to work on weight loss and smoking cessation  Avid spicy and acidic food/bev       Relevant Medications   lansoprazole (PREVACID) 30 MG capsule     Musculoskeletal and Integument   Rash of hands    Intermittent eczema type rash - tx with elocon and moisturizers Has stopped scented body washes  Refilled cream today        Other   Adjustment disorder with mixed anxiety and depressed mood - Primary    Worsened by menopause and stressors  Worse during school season  Continue prozac at current dose Enc counseling as needed       Menopausal and postmenopausal disorder    prozac helps the mood changes Getting by with hot flashes and night sweats  Not a  candidate for HRT in light of smoking-she is aware       TOBACCO ABUSE    Disc in detail risks of smoking and possible outcomes including copd, vascular/ heart disease, cancer , respiratory and sinus infections and also GERD Pt voices understanding  She is not currently ready to quit

## 2015-06-12 NOTE — Assessment & Plan Note (Signed)
prozac helps the mood changes Getting by with hot flashes and night sweats  Not a candidate for HRT in light of smoking-she is aware

## 2015-06-12 NOTE — Assessment & Plan Note (Addendum)
Will refill prevacid -no breakthrough symptoms  Disc diet -pt is enc to work on weight loss and smoking cessation  Avid spicy and acidic food/bev

## 2015-06-12 NOTE — Assessment & Plan Note (Signed)
Disc in detail risks of smoking and possible outcomes including copd, vascular/ heart disease, cancer , respiratory and sinus infections and also GERD Pt voices understanding  She is not currently ready to quit

## 2015-06-12 NOTE — Assessment & Plan Note (Signed)
Intermittent eczema type rash - tx with elocon and moisturizers Has stopped scented body washes  Refilled cream today

## 2015-06-12 NOTE — Assessment & Plan Note (Signed)
Worsened by menopause and stressors  Worse during school season  Continue prozac at current dose Enc counseling as needed

## 2015-09-03 ENCOUNTER — Other Ambulatory Visit: Payer: Self-pay | Admitting: Family Medicine

## 2015-12-29 ENCOUNTER — Ambulatory Visit (INDEPENDENT_AMBULATORY_CARE_PROVIDER_SITE_OTHER): Payer: BC Managed Care – PPO | Admitting: Family Medicine

## 2015-12-29 ENCOUNTER — Encounter: Payer: Self-pay | Admitting: Family Medicine

## 2015-12-29 VITALS — BP 130/80 | HR 78 | Temp 98.1°F | Ht 66.75 in | Wt 160.5 lb

## 2015-12-29 DIAGNOSIS — K219 Gastro-esophageal reflux disease without esophagitis: Secondary | ICD-10-CM | POA: Diagnosis not present

## 2015-12-29 DIAGNOSIS — R21 Rash and other nonspecific skin eruption: Secondary | ICD-10-CM | POA: Diagnosis not present

## 2015-12-29 DIAGNOSIS — F4323 Adjustment disorder with mixed anxiety and depressed mood: Secondary | ICD-10-CM

## 2015-12-29 MED ORDER — RANITIDINE HCL 300 MG PO CAPS: 300.0000 mg | ORAL_CAPSULE | Freq: Two times a day (BID) | ORAL | 11 refills | Status: DC

## 2015-12-29 NOTE — Assessment & Plan Note (Signed)
Now doing ok off fluoxetine  Will continue to watch

## 2015-12-29 NOTE — Assessment & Plan Note (Signed)
Prevacid is no longer working well  Disc diet/handout given for gerd  Trial of ranitidine 300 mg bid  Update

## 2015-12-29 NOTE — Patient Instructions (Addendum)
Stop the lansoprazole  Also do not go back on the fluoxetine  Pick up the ranitidine 300 mg and take twice daily instead for your acid reflux (let's see how that works)  Also watch diet for anything that gives you heartburn You may have go lay off the coffee   Avoid hot water and harsh detergents and scents  Moisturize frequently  Use the elicon cream on affected areas  Stop at check out -we will refer you to dermatology

## 2015-12-29 NOTE — Assessment & Plan Note (Signed)
Ongoing- thought to be eczema  No longer responding to mometasone cream  Using scent free moisturizer Now inc aff areas incl arms and feet  No web space/axilla or groin inv- doubt scabies  Ref to dermatology  Recommend antihistamine otc for itch

## 2015-12-29 NOTE — Progress Notes (Signed)
Pre visit review using our clinic review tool, if applicable. No additional management support is needed unless otherwise documented below in the visit note. 

## 2015-12-29 NOTE — Progress Notes (Signed)
Subjective:    Patient ID: Beth Reynolds, female    DOB: 11/12/1970, 45 y.o.   MRN: [id number]  HPI When she went to [place] at the end of June and she stopped fluoxetine -has not had it since  Thinks mood is stable  Feels less bloated and easier to mt her wt   The lansoprazole is no longer helping her gerd- more heartburn (had been on it for so long- just stopped working)  Her eczema on her hands is worsening  Starts as a small vesicle that itches and then spreads  Now on arms and feet  The elicon that used to work no longer helps  Of note - she was better when she was in [place]  Now when she came home - she is better   Avoids any scents  Uses aaveno moisturizer Wears gloves to do dishes as well    Does not think she came in contact with scabies  No lesions in axillae or groin    Of note-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 Rash of hands 03/18/2014  . Gastritis 01/17/2012  . IBS (irritable bowel syndrome) 01/17/2012  . Hyperglycemia 10/09/2010  . Routine general medical examination at a health care facility 10/04/2010  . MENOPAUSE, EARLY 11/03/2009  . Menopausal and postmenopausal disorder 10/11/2009  . Migraine 01/19/2008  . TOBACCO ABUSE 07/17/2007  . Adjustment disorder with mixed anxiety and depressed mood 07/17/2007  . ALLERGIC RHINITIS 07/17/2007  . GERD 07/17/2007   Past Medical History:  Diagnosis Date  . Allergic rhinoconjunctivitis   . Anxiety   . Early menopause   . Migraine   . PMDD (premenstrual dysphoric disorder)   . TB (tuberculosis), treated   . Tobacco abuse    Past Surgical History:  Procedure Laterality Date  . DILATION AND CURETTAGE OF UTERUS    . LESION REMOVAL     from finger   Social History  Substance Use Topics  . Smoking status: Current Every Day Smoker    Packs/day: 0.50  . Smokeless tobacco: Never Used  . Alcohol use No     Family History  Problem Relation Age of Onset  . Diabetes Father    Allergies  Allergen Reactions  . Avocado     Swollen lips  . Paroxetine     REACTION: sever headache, not able to sleep and weight gain  . Pineapple     Swelling of lips  . Venlafaxine     REACTION: HA   Current Outpatient Prescriptions on File Prior to Visit  Medication Sig Dispense Refill  . calcium carbonate (TUMS - DOSED IN MG ELEMENTAL CALCIUM) 500 MG chewable tablet Chew 1 tablet by mouth as directed.      . mometasone (ELOCON) 0.1 % cream APPLY TO AFFECTED AREA AS NEEDED (ON HANDS AND FEET) 45 g 0   No current facility-administered medications on file prior to visit.     Review of Systems    Review of Systems  Constitutional: Negative for fever, appetite change, fatigue and unexpected weight change.  Eyes: Negative for pain and visual disturbance.  Respiratory: Negative for cough and shortness of breath.   Cardiovascular: Negative for cp or palpitations    Gastrointestinal: Negative for nausea, diarrhea and constipation. pos for heartburn Genitourinary: Negative for urgency and frequency.  Skin: Negative for pallor and pos for itchy rash  Neurological: Negative for weakness, light-headedness, numbness and headaches.  Hematological: Negative for adenopathy. Does not bruise/bleed easily.  Psychiatric/Behavioral: Negative for dysphoric mood. The patient is not nervous/anxious.  (mood is improved)    Objective:   Physical Exam  Constitutional: She appears well-developed and well-nourished. No distress.  overwt and well appearing   HENT:  Head: Normocephalic and atraumatic.  Mouth/Throat: Oropharynx is clear and moist.  Eyes: Conjunctivae and EOM are normal. Pupils are equal, round, and reactive to light.  Neck: Normal range of motion. Neck supple. No JVD present. Carotid bruit is not present. No thyromegaly present.  Cardiovascular: Normal rate, regular rhythm, normal heart sounds and intact  distal pulses.  Exam reveals no gallop.   Pulmonary/Chest: Effort normal and breath sounds normal. No respiratory distress. She has no wheezes. She has no rales.  No crackles  Abdominal: Soft. Bowel sounds are normal. She exhibits no distension, no abdominal bruit and no mass. There is no tenderness.  Musculoskeletal: She exhibits no edema.  Lymphadenopathy:    She has no cervical adenopathy.  Neurological: She is alert. She has normal reflexes.  Skin: Skin is warm and dry. Rash noted.  Areas of tiny papules resembling vesicles on hands (dorsal) and feet- sparing web spaces  Some areas of excoriation  No erythema  Some scale  Psychiatric: She has a normal mood and affect.          Assessment & Plan:   Problem List Items Addressed This Visit      Digestive   GERD - Primary    Prevacid is no longer working well  Disc diet/handout given for gerd  Trial of ranitidine 300 mg bid  Update        Relevant Medications   ranitidine (ZANTAC) 300 MG capsule     Musculoskeletal and Integument   Rash of hands    Ongoing- thought to be eczema  No longer responding to mometasone cream  Using scent free moisturizer Now inc aff areas incl arms and feet  No web space/axilla or groin inv- doubt scabies  Ref to dermatology  Recommend antihistamine otc for itch      Relevant Orders   Ambulatory referral to Dermatology     Other   Adjustment disorder with mixed anxiety and depressed mood    Now doing ok off fluoxetine  Will continue to watch        Other Visit Diagnoses   None.

## 2016-01-14 ENCOUNTER — Ambulatory Visit (INDEPENDENT_AMBULATORY_CARE_PROVIDER_SITE_OTHER): Payer: BC Managed Care – PPO | Admitting: Family Medicine

## 2016-01-14 ENCOUNTER — Encounter: Payer: Self-pay | Admitting: Family Medicine

## 2016-01-14 VITALS — BP 112/70 | HR 89 | Temp 98.5°F | Ht 66.75 in | Wt 161.2 lb

## 2016-01-14 DIAGNOSIS — B9789 Other viral agents as the cause of diseases classified elsewhere: Secondary | ICD-10-CM | POA: Diagnosis not present

## 2016-01-14 DIAGNOSIS — J069 Acute upper respiratory infection, unspecified: Secondary | ICD-10-CM

## 2016-01-14 DIAGNOSIS — R059 Cough, unspecified: Secondary | ICD-10-CM

## 2016-01-14 DIAGNOSIS — R05 Cough: Secondary | ICD-10-CM | POA: Diagnosis not present

## 2016-01-14 LAB — POC INFLUENZA A&B (BINAX/QUICKVUE)
INFLUENZA B, POC: NEGATIVE
Influenza A, POC: NEGATIVE

## 2016-01-14 NOTE — Progress Notes (Signed)
Subjective:    Patient ID: Beth Reynolds, female    DOB: 1970/12/20, 45 y.o.   MRN: [id number]  HPI Here for uri symptoms in a smoker   Yesterday at work she started feeling bad  Full sinuses Congested Chills Glands in neck hurt   Temp at night 100.3  Some cough- just starting dry cough Scratchy throat  Ears hurt some   Took ibuprofen otc  A "sinus pill" last night -? What brand   Smoking is the same   Flu test neg Results for orders placed or performed in visit on 01/14/16  POC Influenza A&B(BINAX/QUICKVUE)  Result Value Ref Range   Influenza A, POC Negative Negative   Influenza B, POC Negative Negative   she declines flu shots   No change in smoking-not ready to quit   Patient Active Problem List   Diagnosis Date Noted  . Rash of hands 03/18/2014  . Gastritis 01/17/2012  . IBS (irritable bowel syndrome) 01/17/2012  . Hyperglycemia 10/09/2010  . Routine general medical examination at a health care facility 10/04/2010  . MENOPAUSE, EARLY 11/03/2009  . Menopausal and postmenopausal disorder 10/11/2009  . Migraine 01/19/2008  . TOBACCO ABUSE 07/17/2007  . Adjustment disorder with mixed anxiety and depressed mood 07/17/2007  . ALLERGIC RHINITIS 07/17/2007  . GERD 07/17/2007   Past Medical History:  Diagnosis Date  . Allergic rhinoconjunctivitis   . Anxiety   . Early menopause   . Migraine   . PMDD (premenstrual dysphoric disorder)   . TB (tuberculosis), treated   . Tobacco abuse    Past Surgical History:  Procedure Laterality Date  . DILATION AND CURETTAGE OF UTERUS    . LESION REMOVAL     from finger   Social History  Substance Use Topics  . Smoking status: Current Every Day Smoker    Packs/day: 0.50  . Smokeless tobacco: Never Used  . Alcohol use No   Family History  Problem Relation Age of Onset  . Diabetes Father    Allergies  Allergen Reactions  . Avocado     Swollen lips  . Paroxetine     REACTION: sever headache, not able to  sleep and weight gain  . Pineapple     Swelling of lips  . Venlafaxine     REACTION: HA   Current Outpatient Prescriptions on File Prior to Visit  Medication Sig Dispense Refill  . calcium carbonate (TUMS - DOSED IN MG ELEMENTAL CALCIUM) 500 MG chewable tablet Chew 1 tablet by mouth as directed.      . mometasone (ELOCON) 0.1 % cream APPLY TO AFFECTED AREA AS NEEDED (ON HANDS AND FEET) 45 g 0  . ranitidine (ZANTAC) 300 MG capsule Take 1 capsule (300 mg total) by mouth 2 (two) times daily. 60 capsule 11   No current facility-administered medications on file prior to visit.      Review of Systems  Constitutional: Positive for fatigue. Negative for activity change, appetite change and fever.  HENT: Positive for postnasal drip, rhinorrhea, sinus pressure and sneezing. Negative for congestion, ear pain and sore throat.   Eyes: Negative for pain, discharge, itching and visual disturbance.  Respiratory: Negative for cough, shortness of breath, wheezing and stridor.   Cardiovascular: Negative for chest pain and leg swelling.  Gastrointestinal: Negative for abdominal distention, abdominal pain, constipation, diarrhea, nausea and vomiting.  Endocrine: Negative for cold intolerance and polydipsia.  Genitourinary: Negative for difficulty urinating, dysuria, flank pain, frequency, hematuria and urgency.  Musculoskeletal: Negative for  arthralgias and myalgias.  Skin: Negative for rash.  Allergic/Immunologic: Negative for immunocompromised state.  Neurological: Positive for headaches. Negative for dizziness, weakness and light-headedness.  Hematological: Negative for adenopathy.  Psychiatric/Behavioral: Negative for confusion and dysphoric mood.       Objective:   Physical Exam  Constitutional: She appears well-developed and well-nourished. No distress.  HENT:  Head: Normocephalic and atraumatic.  Right Ear: External ear normal.  Left Ear: External ear normal.  Mouth/Throat: Oropharynx is  clear and moist.  Nares are injected and congested  No sinus tenderness Clear rhinorrhea and post nasal drip   Eyes: Conjunctivae and EOM are normal. Pupils are equal, round, and reactive to light. Right eye exhibits no discharge. Left eye exhibits no discharge.  Neck: Normal range of motion. Neck supple.  Cardiovascular: Normal rate and normal heart sounds.   Pulmonary/Chest: Effort normal and breath sounds normal. No respiratory distress. She has no wheezes. She has no rales. She exhibits no tenderness.  No wheeze even on forced exp  Lymphadenopathy:    She has no cervical adenopathy.  Neurological: She is alert.  Skin: Skin is warm and dry. No rash noted.  Psychiatric: She has a normal mood and affect.          Assessment & Plan:   Problem List Items Addressed This Visit      Respiratory   Viral URI with cough    Re assuring exam Disc symptomatic care - see instructions on AVS  At risk for bacterial infx as a smoker-will watch for s/s of pneumonia or sinusitis Update if not starting to improve in a week or if worsening         Other Visit Diagnoses    Cough    -  Primary   Relevant Orders   POC Influenza A&B(BINAX/QUICKVUE) (Completed)

## 2016-01-14 NOTE — Progress Notes (Signed)
Pre visit review using our clinic review tool, if applicable. No additional management support is needed unless otherwise documented below in the visit note. 

## 2016-01-14 NOTE — Patient Instructions (Addendum)
Drink lots of fluids and rest Ibuprofen is good for fever and body aches (take with food as directed)  For post nasal drip try zyrtec 10 mg over the counter one pill daily  Nasal saline spray for congestion/ also breathe steam  For cough - mucinex DM over the counter -it will also loosen congestion  Update if not starting to improve in a week or if worsening

## 2016-01-15 NOTE — Assessment & Plan Note (Signed)
Re assuring exam Disc symptomatic care - see instructions on AVS  At risk for bacterial infx as a smoker-will watch for s/s of pneumonia or sinusitis Update if not starting to improve in a week or if worsening

## 2016-01-25 ENCOUNTER — Other Ambulatory Visit: Payer: Self-pay | Admitting: Family Medicine

## 2016-01-26 NOTE — Telephone Encounter (Signed)
done

## 2016-01-26 NOTE — Telephone Encounter (Signed)
Received refill electronically Last refill 09/04/15 45 G Last office visit 01/14/16/acute

## 2016-01-26 NOTE — Telephone Encounter (Signed)
Please refill times 3 

## 2016-01-27 ENCOUNTER — Encounter: Payer: Self-pay | Admitting: Family Medicine

## 2016-02-04 ENCOUNTER — Encounter: Payer: Self-pay | Admitting: Family Medicine

## 2016-02-04 ENCOUNTER — Ambulatory Visit (INDEPENDENT_AMBULATORY_CARE_PROVIDER_SITE_OTHER): Payer: BC Managed Care – PPO | Admitting: Family Medicine

## 2016-02-04 VITALS — BP 126/74 | HR 97 | Temp 98.2°F | Ht 66.75 in | Wt 162.2 lb

## 2016-02-04 DIAGNOSIS — J209 Acute bronchitis, unspecified: Secondary | ICD-10-CM | POA: Diagnosis not present

## 2016-02-04 MED ORDER — ALBUTEROL SULFATE HFA 108 (90 BASE) MCG/ACT IN AERS: 2.0000 | INHALATION_SPRAY | RESPIRATORY_TRACT | 0 refills | Status: DC | PRN

## 2016-02-04 MED ORDER — GUAIFENESIN-CODEINE 100-10 MG/5ML PO SYRP: 5.0000 mL | ORAL_SOLUTION | Freq: Four times a day (QID) | ORAL | 0 refills | Status: DC | PRN

## 2016-02-04 MED ORDER — AZITHROMYCIN 250 MG PO TABS: ORAL_TABLET | ORAL | 0 refills | Status: DC

## 2016-02-04 MED ORDER — PREDNISONE 10 MG PO TABS: ORAL_TABLET | ORAL | 0 refills | Status: DC

## 2016-02-04 NOTE — Patient Instructions (Signed)
Drink lots of fluids  Try the robitussin with codeine when not working or driving Take zpak for bronchitis Prednisone for wheezing  Albuterol inhaler as needed for wheezing   Update if not starting to improve in a week or if worsening

## 2016-02-04 NOTE — Progress Notes (Signed)
Subjective:    Patient ID: Beth DowningMoeneebah Hallowell, female    DOB: [birth year], 45 y.o.   MRN: [id number]  HPI  Here for continued uri symptoms   Was seen in oct  Neg flu test   Symptoms did not improve until today  Non productive cough (some wheezing) Temp was down to 99 last night (improved) Today she is starting to turn the corner   Taking mucinex or robitussin  Tylenol Ibuprofen  vics vapor rub and cough drops   Sinus pressure but not pain  Today blowing out clear mucous   Patient Active Problem List   Diagnosis Date Noted  . Acute bronchitis 02/04/2016  . Viral URI with cough 01/14/2016  . Rash of hands 03/18/2014  . Gastritis 01/17/2012  . IBS (irritable bowel syndrome) 01/17/2012  . Hyperglycemia 10/09/2010  . Routine general medical examination at a health care facility 10/04/2010  . MENOPAUSE, EARLY 11/03/2009  . Menopausal and postmenopausal disorder 10/11/2009  . Migraine 01/19/2008  . TOBACCO ABUSE 07/17/2007  . Adjustment disorder with mixed anxiety and depressed mood 07/17/2007  . ALLERGIC RHINITIS 07/17/2007  . GERD 07/17/2007   Past Medical History:  Diagnosis Date  . Allergic rhinoconjunctivitis   . Anxiety   . Early menopause   . Migraine   . PMDD (premenstrual dysphoric disorder)   . TB (tuberculosis), treated   . Tobacco abuse    Past Surgical History:  Procedure Laterality Date  . DILATION AND CURETTAGE OF UTERUS    . LESION REMOVAL     from finger   Social History  Substance Use Topics  . Smoking status: Current Every Day Smoker    Packs/day: 0.50  . Smokeless tobacco: Never Used  . Alcohol use No   Family History  Problem Relation Age of Onset  . Diabetes Father    Allergies  Allergen Reactions  . Avocado     Swollen lips  . Paroxetine     REACTION: sever headache, not able to sleep and weight gain  . Pineapple     Swelling of lips  . Venlafaxine     REACTION: HA   Current Outpatient Prescriptions on File Prior to Visit    Medication Sig Dispense Refill  . calcium carbonate (TUMS - DOSED IN MG ELEMENTAL CALCIUM) 500 MG chewable tablet Chew 1 tablet by mouth as directed.      . mometasone (ELOCON) 0.1 % cream APPLY TO AFFECTED AREA AS NEEDED (ON HANDS AND FEET) 45 g 3  . ranitidine (ZANTAC) 300 MG capsule Take 1 capsule (300 mg total) by mouth 2 (two) times daily. 60 capsule 11   No current facility-administered medications on file prior to visit.     Review of Systems  Constitutional: Positive for appetite change and fatigue. Negative for fever.       Pos for low grade temp but not fever   HENT: Positive for congestion, postnasal drip, rhinorrhea, sinus pressure, sneezing and sore throat. Negative for ear pain.   Eyes: Negative for pain and discharge.  Respiratory: Positive for cough, chest tightness and wheezing. Negative for shortness of breath and stridor.   Cardiovascular: Negative for chest pain.  Gastrointestinal: Negative for diarrhea, nausea and vomiting.  Genitourinary: Negative for frequency, hematuria and urgency.  Musculoskeletal: Negative for arthralgias and myalgias.  Skin: Negative for rash.  Neurological: Positive for headaches. Negative for dizziness, weakness and light-headedness.  Psychiatric/Behavioral: Negative for confusion and dysphoric mood.       Objective:   Physical  Exam  Constitutional: She appears well-developed and well-nourished. No distress.  Fatigued but well appearing   HENT:  Head: Normocephalic and atraumatic.  Right Ear: External ear normal.  Left Ear: External ear normal.  Mouth/Throat: Oropharynx is clear and moist.  Nares are injected and congested  Mild maxillary sinus tenderness Clear rhinorrhea and post nasal drip   Eyes: Conjunctivae and EOM are normal. Pupils are equal, round, and reactive to light. Right eye exhibits no discharge. Left eye exhibits no discharge.  Neck: Normal range of motion. Neck supple.  Cardiovascular: Normal rate and normal heart  sounds.   Pulmonary/Chest: Effort normal. No respiratory distress. She has wheezes. She has no rales. She exhibits no tenderness.  Harsh bs Diffuse exp wheeze - louder at upper lung areas  No prolonged exp phase No labored breathing   Lymphadenopathy:    She has no cervical adenopathy.  Neurological: She is alert.  Skin: Skin is warm and dry. No rash noted. No pallor.  Psychiatric: She has a normal mood and affect.          Assessment & Plan:   Problem List Items Addressed This Visit      Respiratory   Acute bronchitis    With bronchospasm in a smoker - over 2 weeks of symptoms  Cover with zpak - high risk for bact inf pred taper 30 mg -disc way to take and expected side eff  Albuterol mdi with inst for use prn  Robitussin with codiene -with caution for sedation (as needed for cough) Update if not starting to improve in a week or if worsening   Did enc her to quit smoking

## 2016-02-04 NOTE — Progress Notes (Signed)
Pre visit review using our clinic review tool, if applicable. No additional management support is needed unless otherwise documented below in the visit note. 

## 2016-02-04 NOTE — Assessment & Plan Note (Signed)
With bronchospasm in a smoker - over 2 weeks of symptoms  Cover with zpak - high risk for bact inf pred taper 30 mg -disc way to take and expected side eff  Albuterol mdi with inst for use prn  Robitussin with codiene -with caution for sedation (as needed for cough) Update if not starting to improve in a week or if worsening   Did enc her to quit smoking

## 2016-06-30 ENCOUNTER — Other Ambulatory Visit: Payer: Self-pay | Admitting: Family Medicine

## 2016-07-01 NOTE — Telephone Encounter (Signed)
Will refill electronically  

## 2016-07-01 NOTE — Telephone Encounter (Signed)
Spoke to pt and she did restart the prozac after her last appt with you in 12/2015, and she also requested to have Rx for prevacid she said this med works better then the zantac, she said the zantac didn't work at all, please advise

## 2016-07-14 ENCOUNTER — Ambulatory Visit (INDEPENDENT_AMBULATORY_CARE_PROVIDER_SITE_OTHER): Payer: BC Managed Care – PPO | Admitting: Family Medicine

## 2016-07-14 ENCOUNTER — Encounter: Payer: Self-pay | Admitting: Family Medicine

## 2016-07-14 VITALS — BP 142/84 | HR 80 | Temp 98.1°F | Ht 66.75 in | Wt 160.5 lb

## 2016-07-14 DIAGNOSIS — F4323 Adjustment disorder with mixed anxiety and depressed mood: Secondary | ICD-10-CM

## 2016-07-14 DIAGNOSIS — H1010 Acute atopic conjunctivitis, unspecified eye: Secondary | ICD-10-CM | POA: Insufficient documentation

## 2016-07-14 DIAGNOSIS — J301 Allergic rhinitis due to pollen: Secondary | ICD-10-CM | POA: Diagnosis not present

## 2016-07-14 DIAGNOSIS — H1013 Acute atopic conjunctivitis, bilateral: Secondary | ICD-10-CM

## 2016-07-14 MED ORDER — MONTELUKAST SODIUM 10 MG PO TABS: 10.0000 mg | ORAL_TABLET | Freq: Every day | ORAL | 5 refills | Status: DC

## 2016-07-14 MED ORDER — FLUOXETINE HCL 20 MG PO TABS: 30.0000 mg | ORAL_TABLET | Freq: Every day | ORAL | 11 refills | Status: DC

## 2016-07-14 MED ORDER — OLOPATADINE HCL 0.2 % OP SOLN: OPHTHALMIC | 5 refills | Status: DC

## 2016-07-14 NOTE — Patient Instructions (Addendum)
Close windows if needed to avoid pollen  Take zyrtec 10 mg each bedtime Take singulaire 10 mg each bedtime  Use the pataday eye drops once a day   Do this as long as you need it through allergy season   Go ahead and increase the fluoxetine to 1 1/2 pills once daily now  Let us know if you want to see a counselor at any time

## 2016-07-14 NOTE — Progress Notes (Signed)
Pre visit review using our clinic review tool, if applicable. No additional management support is needed unless otherwise documented below in the visit note. 

## 2016-07-14 NOTE — Progress Notes (Signed)
Subjective:    Patient ID: Beth Reynolds, female    DOB: Aug 02, 1970, 46 y.o.   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[employer], broadcasting/film/video (end of the school season)   Has windows open at home all day and night  Has air filters in house   Pollen is bothering her / spring allergies  Itchy throat and palate  Eyes are running and itching constantly  Crusty in the am    Headache/sinus - frontal  No colored d/c    Nosebleeds and runny nose  Not a lot of sneezing   Taking zyrtec 10 mg daily  Also [facility] allergy med - similar to benadryl /makes her sleepy   Also eye drops - visine allergy - helps just a little   No flonase currently    Smoking status -the same Not ready to quit No cough or sob  Patient Active Problem List   Diagnosis Date Noted  . Allergic conjunctivitis 07/14/2016  . Rash of hands 03/18/2014  . Gastritis 01/17/2012  . IBS (irritable bowel syndrome) 01/17/2012  . Hyperglycemia 10/09/2010  . Routine general medical examination at a health care facility 10/04/2010  . MENOPAUSE, EARLY 11/03/2009  . Menopausal and postmenopausal disorder 10/11/2009  . Migraine 01/19/2008  . TOBACCO ABUSE 07/17/2007  . Adjustment disorder with mixed anxiety and depressed mood 07/17/2007  . Allergic rhinitis 07/17/2007  . GERD 07/17/2007   Past Medical History:  Diagnosis Date  . Allergic rhinoconjunctivitis   . Anxiety   . Early menopause   . Migraine   . PMDD (premenstrual dysphoric disorder)   . TB (tuberculosis), treated   . Tobacco abuse    Past Surgical History:  Procedure Laterality Date  . DILATION AND CURETTAGE OF UTERUS    . LESION REMOVAL     from finger   Social History  Substance Use Topics  . Smoking status: Current Every Day Smoker    Packs/day:  0.50  . Smokeless tobacco: Never Used  . Alcohol use No   Family History  Problem Relation Age of Onset  . Diabetes Father    Allergies  Allergen Reactions  . Avocado     Swollen lips  . Paroxetine     REACTION: sever headache, not able to sleep and weight gain  . Pineapple     Swelling of lips  . Venlafaxine     REACTION: HA   Current Outpatient Prescriptions on File Prior to Visit  Medication Sig Dispense Refill  . albuterol (PROVENTIL HFA;VENTOLIN HFA) 108 (90 Base) MCG/ACT inhaler Inhale 2 puffs into the lungs every 4 (four) hours as needed for wheezing. 1 Inhaler 0  . calcium carbonate (TUMS - DOSED IN MG ELEMENTAL CALCIUM) 500 MG chewable tablet Chew 1 tablet by mouth as directed.      . lansoprazole (PREVACID) 30 MG capsule TAKE 1 CAPSULE (30 MG TOTAL) BY MOUTH DAILY. 30 capsule 11  . mometasone (ELOCON) 0.1 % cream APPLY TO AFFECTED AREA AS NEEDED (ON HANDS AND FEET) 45 g 3   No current facility-administered medications on file prior to visit.     Review of Systems Review of Systems  Constitutional: Negative for fever, appetite change, fatigue and unexpected weight change.  Eyes: Negative for pain and visual  disturbance. pos for eye itch/watering  ENT pos for rhinorrhea/itchy nose and throat and sinus pressure  Respiratory: Negative for cough and shortness of breath.   Cardiovascular: Negative for cp or palpitations    Gastrointestinal: Negative for nausea, diarrhea and constipation.  Genitourinary: Negative for urgency and frequency.  Skin: Negative for pallor or rash   Neurological: Negative for weakness, light-headedness, numbness and headaches.  Hematological: Negative for adenopathy. Does not bruise/bleed easily.  Psychiatric/Behavioral: Negative for dysphoric mood. The patient is not nervous/anxious.         Objective:   Physical Exam  Constitutional: She appears well-developed and well-nourished. No distress.  Well appearing   HENT:  Head: Normocephalic  and atraumatic.  Right Ear: External ear normal.  Left Ear: External ear normal.  Mouth/Throat: Oropharynx is clear and moist. No oropharyngeal exudate.  Nares are injected and congested  Clear rhinorrhea No facial tenderness  Eyes: EOM are normal. Pupils are equal, round, and reactive to light. Right eye exhibits no discharge. Left eye exhibits no discharge. No scleral icterus.  Mildly injected conj bilat with clear d/c (tearing)  Neck: Normal range of motion. Neck supple.  Cardiovascular: Normal rate and normal heart sounds.   Pulmonary/Chest: Effort normal and breath sounds normal. No respiratory distress. She has no wheezes. She has no rales.  Mildly distant bs  Lymphadenopathy:    She has no cervical adenopathy.  Neurological: She is alert. No cranial nerve deficit.  Skin: Skin is warm and dry. No rash noted. No erythema.  Psychiatric: She has a normal mood and affect.          Assessment & Plan:   Problem List Items Addressed This Visit      Respiratory   Allergic rhinitis - Primary    Spring/tree pollen seasonal  Disc allergen avoidance  Zyrtec 10 mg daily  Will add singulaire 10 mg daily  Prn flonase otc if she thinks it helps  Update if no imp         Other   Adjustment disorder with mixed anxiety and depressed mood    With inc stressors pt wants to inc fluoxetine to 30 mg daily  Will try this Discussed expectations of SSRI medication including time to effectiveness and mechanism of action, also poss of side effects (early and late)- including mental fuzziness, weight or appetite change, nausea and poss of worse dep or anxiety (even suicidal thoughts)  Pt voiced understanding and will stop med and update if this occurs  Reviewed stressors/ coping techniques/symptoms/ support sources/ tx options and side effects in detail today Offered counseling       Allergic conjunctivitis    Trial of pataday eye drops daily prn  Also artificial tears as necessary    Allergen avoidance  Update if no improvement

## 2016-07-15 NOTE — Assessment & Plan Note (Signed)
With inc stressors pt wants to inc fluoxetine to 30 mg daily  Will try this Discussed expectations of SSRI medication including time to effectiveness and mechanism of action, also poss of side effects (early and late)- including mental fuzziness, weight or appetite change, nausea and poss of worse dep or anxiety (even suicidal thoughts)  Pt voiced understanding and will stop med and update if this occurs  Reviewed stressors/ coping techniques/symptoms/ support sources/ tx options and side effects in detail today Offered counseling

## 2016-07-15 NOTE — Assessment & Plan Note (Signed)
Spring/tree pollen seasonal  Disc allergen avoidance  Zyrtec 10 mg daily  Will add singulaire 10 mg daily  Prn flonase otc if she thinks it helps  Update if no imp

## 2016-07-15 NOTE — Assessment & Plan Note (Signed)
Trial of pataday eye drops daily prn  Also artificial tears as necessary  Allergen avoidance  Update if no improvement

## 2016-08-11 ENCOUNTER — Encounter: Payer: Self-pay | Admitting: Family Medicine

## 2016-08-11 ENCOUNTER — Ambulatory Visit (INDEPENDENT_AMBULATORY_CARE_PROVIDER_SITE_OTHER): Payer: BC Managed Care – PPO | Admitting: Family Medicine

## 2016-08-11 VITALS — BP 110/66 | HR 98 | Temp 98.5°F | Ht 66.75 in | Wt 161.5 lb

## 2016-08-11 DIAGNOSIS — J209 Acute bronchitis, unspecified: Secondary | ICD-10-CM | POA: Diagnosis not present

## 2016-08-11 DIAGNOSIS — F17200 Nicotine dependence, unspecified, uncomplicated: Secondary | ICD-10-CM

## 2016-08-11 MED ORDER — AZITHROMYCIN 250 MG PO TABS: ORAL_TABLET | ORAL | 0 refills | Status: DC

## 2016-08-11 MED ORDER — HYDROCODONE-HOMATROPINE 5-1.5 MG/5ML PO SYRP: 5.0000 mL | ORAL_SOLUTION | Freq: Three times a day (TID) | ORAL | 0 refills | Status: DC | PRN

## 2016-08-11 NOTE — Assessment & Plan Note (Signed)
No cig in 4 d due to illness Enc her to quit Disc in detail risks of smoking and possible outcomes including copd, vascular/ heart disease, cancer , respiratory and sinus infections  Pt voices understanding

## 2016-08-11 NOTE — Progress Notes (Signed)
Pre visit review using our clinic review tool, if applicable. No additional management support is needed unless otherwise documented below in the visit note. 

## 2016-08-11 NOTE — Progress Notes (Signed)
Subjective:    Patient ID: Beth Reynolds, female    DOB: 1970/04/26, 46 y.o.   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 Allergic conjunctivitis 07/14/2016  . Rash of hands 03/18/2014  . Gastritis 01/17/2012  . IBS (irritable bowel syndrome) 01/17/2012  . Hyperglycemia 10/09/2010  . Routine general medical examination at a health care facility 10/04/2010  . MENOPAUSE, EARLY 11/03/2009  . Menopausal and postmenopausal disorder 10/11/2009  . Migraine 01/19/2008  . TOBACCO ABUSE 07/17/2007  . Adjustment disorder with mixed anxiety and depressed mood 07/17/2007  . Allergic rhinitis 07/17/2007  . GERD 07/17/2007   Past Medical History:  Diagnosis Date  . Allergic rhinoconjunctivitis   . Anxiety   . Early menopause   . Migraine   . PMDD (premenstrual dysphoric disorder)   . TB (tuberculosis), treated   . Tobacco abuse    Past Surgical History:  Procedure Laterality Date  . DILATION AND CURETTAGE OF UTERUS    . LESION REMOVAL     from finger   Social History  Substance Use Topics  . Smoking status: Current Every Day Smoker    Packs/day: 0.50  . Smokeless tobacco: Never Used  . Alcohol use No   Family History  Problem Relation Age of Onset  . Diabetes Father    Allergies  Allergen Reactions  . Avocado     Swollen lips  . Paroxetine     REACTION: sever headache, not able to sleep and weight gain  . Pineapple     Swelling of lips  . Venlafaxine     REACTION: HA   Current Outpatient Prescriptions on File Prior to Visit  Medication  Sig Dispense Refill  . albuterol (PROVENTIL HFA;VENTOLIN HFA) 108 (90 Base) MCG/ACT inhaler Inhale 2 puffs into the lungs every 4 (four) hours as needed for wheezing. 1 Inhaler 0  . calcium carbonate (TUMS - DOSED IN MG ELEMENTAL CALCIUM) 500 MG chewable tablet Chew 1 tablet by mouth as directed.      Marland Kitchen FLUoxetine (PROZAC) 20 MG tablet Take 1.5 tablets (30 mg total) by mouth daily. 45 tablet 11  . lansoprazole (PREVACID) 30 MG capsule TAKE 1 CAPSULE (30 MG TOTAL) BY MOUTH DAILY. 30 capsule 11  . mometasone (ELOCON) 0.1 % cream APPLY TO AFFECTED AREA AS NEEDED (ON HANDS AND FEET) 45 g 3  . montelukast (SINGULAIR) 10 MG tablet Take 1 tablet (10 mg total) by mouth at bedtime. 30 tablet 5  . Olopatadine HCl 0.2 % SOLN Place one drop in each eye once daily during allergy season 2.5 mL 5   No current facility-administered medications on file prior to visit.     Review of Systems  Constitutional: Positive for appetite change and fatigue. Negative for fever.  HENT: Positive for congestion, postnasal drip, rhinorrhea, sinus pressure, sneezing and sore throat. Negative for ear pain.   Eyes: Negative for pain and discharge.  Respiratory: Positive for cough.  Negative for shortness of breath, wheezing and stridor.   Cardiovascular: Negative for chest pain.  Gastrointestinal: Negative for diarrhea, nausea and vomiting.  Genitourinary: Negative for frequency, hematuria and urgency.  Musculoskeletal: Negative for arthralgias and myalgias.  Skin: Negative for rash.  Neurological: Positive for headaches. Negative for dizziness, weakness and light-headedness.  Psychiatric/Behavioral: Negative for confusion and dysphoric mood.       Objective:   Physical Exam  Constitutional: She appears well-developed and well-nourished. No distress.  Fatigued but well appearing  HENT:  Head: Normocephalic and atraumatic.  Right Ear: External ear normal.  Left Ear: External ear normal.  Mouth/Throat: Oropharynx is  clear and moist.  Nares are injected and congested  No sinus tenderness Clear rhinorrhea and post nasal drip   Eyes: Conjunctivae and EOM are normal. Pupils are equal, round, and reactive to light. Right eye exhibits no discharge. Left eye exhibits no discharge.  Neck: Normal range of motion. Neck supple.  Cardiovascular: Normal rate and normal heart sounds.   Pulmonary/Chest: Effort normal and breath sounds normal. No respiratory distress. She has no wheezes. She has no rales. She exhibits no tenderness.  Harsh bs Wheeze on forced exp only Few rhonchi-not focal  No rales  Good air exch  Lymphadenopathy:    She has no cervical adenopathy.  Neurological: She is alert.  Skin: Skin is warm and dry. No rash noted.  Psychiatric: She has a normal mood and affect.          Assessment & Plan:   Problem List Items Addressed This Visit      Respiratory   Acute bronchitis - Primary    In a smoker Smoking cessation adv Cover with zpack in light of length of symptoms and smoking status  Albuterol mdi -handout on use  Hycodan -prn cough with sedation warning Tessalon tid prn  Expectorant prn  Out of work tomorrow Rest voice Update if not starting to improve in a week or if worsening          Other   TOBACCO ABUSE    No cig in 4 d due to illness Enc her to quit Disc in detail risks of smoking and possible outcomes including copd, vascular/ heart disease, cancer , respiratory and sinus infections  Pt voices understanding

## 2016-08-11 NOTE — Assessment & Plan Note (Addendum)
In a smoker Smoking cessation adv Cover with zpack in light of length of symptoms and smoking status  Albuterol mdi -handout on use  Hycodan -prn cough with sedation warning Tessalon tid prn  Expectorant prn  Out of work tomorrow Rest voice Update if not starting to improve in a week or if worsening

## 2016-08-11 NOTE — Patient Instructions (Signed)
Drink lots of fluids Take the zpak as directed Use inhaler when needed  Tessalon pills are ok for cough every 8 hours as needed Use the hycodan cough syrup when not working or driving  Rest and fluids   Update if not starting to improve in a week or if worsening    Stay out of work tomorrow

## 2016-10-06 ENCOUNTER — Ambulatory Visit (INDEPENDENT_AMBULATORY_CARE_PROVIDER_SITE_OTHER): Payer: BC Managed Care – PPO | Admitting: Family Medicine

## 2016-10-06 ENCOUNTER — Encounter: Payer: Self-pay | Admitting: Family Medicine

## 2016-10-06 VITALS — BP 138/82 | HR 94 | Temp 98.6°F | Ht 66.75 in | Wt 163.5 lb

## 2016-10-06 DIAGNOSIS — E860 Dehydration: Secondary | ICD-10-CM | POA: Diagnosis not present

## 2016-10-06 DIAGNOSIS — K219 Gastro-esophageal reflux disease without esophagitis: Secondary | ICD-10-CM

## 2016-10-06 DIAGNOSIS — T675XXA Heat exhaustion, unspecified, initial encounter: Secondary | ICD-10-CM

## 2016-10-06 DIAGNOSIS — F17200 Nicotine dependence, unspecified, uncomplicated: Secondary | ICD-10-CM

## 2016-10-06 DIAGNOSIS — R110 Nausea: Secondary | ICD-10-CM

## 2016-10-06 DIAGNOSIS — F4323 Adjustment disorder with mixed anxiety and depressed mood: Secondary | ICD-10-CM | POA: Diagnosis not present

## 2016-10-06 NOTE — Patient Instructions (Addendum)
Labs today  You need 2-3 days inside in air conditioning resting and drinking fluids  Aim for 64 oz fluids per day every day  Think about quitting smoking   (the patch is fine)   If not improved in 2-3 days - then start back on fluoxetine  If still not improving in 1-2 weeks- alert me (or if worse at any time)  For heartburn- if you want to add zanac 75 mg over the counter twice daily that is ok (if you feel like you need extra control)

## 2016-10-06 NOTE — Progress Notes (Signed)
Subjective:    Patient ID: Beth Reynolds, female    DOB: 27-May-1970, 46 y.o.   MRN: [id number]  HPI Here for malaise with frontal dull headache , nausea, bowel changes and dizziness  Also fatigue and lack of motivation   Alt constipation an diarrhea Bloating and abd cramps occ No fever No blood in stool or dark stool No urinary symptoms   No stressors  Came off her fluoxetine in early June  No tick bites  Has been working out in the heat  Then sitting at [employer] ball games in the sun in the evening  Not drinking much fluid at all   Not depressed but is generally   ua concentrated but clear today  Patient Active Problem List   Diagnosis Date Noted  . Dehydration 10/06/2016  . Heat exhaustion 10/06/2016  . Nausea 10/06/2016  . Allergic conjunctivitis 07/14/2016  . Rash of hands 03/18/2014  . Gastritis 01/17/2012  . IBS (irritable bowel syndrome) 01/17/2012  . Hyperglycemia 10/09/2010  . Routine general medical examination at a health care facility 10/04/2010  . MENOPAUSE, EARLY 11/03/2009  . Menopausal and postmenopausal disorder 10/11/2009  . Migraine 01/19/2008  . TOBACCO ABUSE 07/17/2007  . Adjustment disorder with mixed anxiety and depressed mood 07/17/2007  . Allergic rhinitis 07/17/2007  . GERD 07/17/2007   Past Medical History:  Diagnosis Date  . Allergic rhinoconjunctivitis   . Anxiety   . Early menopause   . Migraine   . PMDD (premenstrual dysphoric disorder)   . TB (tuberculosis), treated   . Tobacco abuse    Past Surgical History:  Procedure Laterality Date  . DILATION AND CURETTAGE OF UTERUS    . LESION REMOVAL     from finger   Social History  Substance Use Topics  . Smoking status: Current Every Day Smoker    Packs/day: 0.50  . Smokeless tobacco: Never Used  . Alcohol use No   Family History  Problem Relation Age of Onset  . Diabetes Father    Allergies  Allergen Reactions  . Avocado     Swollen lips  . Paroxetine    REACTION: sever headache, not able to sleep and weight gain  . Pineapple     Swelling of lips  . Venlafaxine     REACTION: HA   Current Outpatient Prescriptions on File Prior to Visit  Medication Sig Dispense Refill  . albuterol (PROVENTIL HFA;VENTOLIN HFA) 108 (90 Base) MCG/ACT inhaler Inhale 2 puffs into the lungs every 4 (four) hours as needed for wheezing. 1 Inhaler 0  . calcium carbonate (TUMS - DOSED IN MG ELEMENTAL CALCIUM) 500 MG chewable tablet Chew 1 tablet by mouth as directed.      Marland Kitchen. FLUoxetine (PROZAC) 20 MG tablet Take 1.5 tablets (30 mg total) by mouth daily. 45 tablet 11  . lansoprazole (PREVACID) 30 MG capsule TAKE 1 CAPSULE (30 MG TOTAL) BY MOUTH DAILY. 30 capsule 11  . mometasone (ELOCON) 0.1 % cream APPLY TO AFFECTED AREA AS NEEDED (ON HANDS AND FEET) 45 g 3  . Olopatadine HCl 0.2 % SOLN Place one drop in each eye once daily during allergy season 2.5 mL 5   No current facility-administered medications on file prior to visit.     Review of Systems Review of Systems  Constitutional: Negative for fever, appetite change,  and unexpected weight change. Pos for fatigue and malaise   Eyes: Negative for pain and visual disturbance.  Respiratory: Negative for cough and shortness of breath.  Cardiovascular: Negative for cp or palpitations    Gastrointestinal: pos for nausea, diarrhea and constipation. neg for blood in stool or dark stool  Genitourinary: Negative for urgency and frequency.  Skin: Negative for pallor or rash  ? For flushing  Neurological: pos for gen weakness, light-headedness, numbness and headaches. neg for slurred speech or facial droop Hematological: Negative for adenopathy. Does not bruise/bleed easily.  Psychiatric/Behavioral: Negative for dysphoric mood. The patient is not nervous/anxious.         Objective:   Physical Exam  Constitutional: She appears well-developed and well-nourished. No distress.  Fatigued but well appearing   HENT:  Head:  Normocephalic and atraumatic.  Right Ear: External ear normal.  Left Ear: External ear normal.  Nose: Nose normal.  Mouth/Throat: Oropharynx is clear and moist.  Mouth is a little dry  Eyes: Pupils are equal, round, and reactive to light. Conjunctivae and EOM are normal. Right eye exhibits no discharge. Left eye exhibits no discharge. No scleral icterus.  Neck: Normal range of motion. Neck supple. No JVD present. Carotid bruit is not present. No thyromegaly present.  Cardiovascular: Regular rhythm, normal heart sounds and intact distal pulses.  Exam reveals no gallop.   Pulse in 90s and regular   Pulmonary/Chest: Effort normal and breath sounds normal. No respiratory distress. She has no wheezes. She has no rales. She exhibits no tenderness.  Abdominal: Soft. Bowel sounds are normal. She exhibits no distension and no mass. There is no tenderness.  Musculoskeletal: She exhibits no edema or tenderness.  No joint changes  Lymphadenopathy:    She has no cervical adenopathy.  Neurological: She is alert. She has normal reflexes. No cranial nerve deficit. She exhibits normal muscle tone. Coordination normal.  Skin: Skin is warm and dry. No rash noted. No erythema. No pallor.  No rash No mottling or flushing Brisk cap refill   Psychiatric: Her speech is normal and behavior is normal. Thought content normal. Her affect is blunt.  Fatigued generally          Assessment & Plan:   Problem List Items Addressed This Visit      Digestive   GERD    Continue prevacid Add zantac 75 otc bid prn breakthrough  Disc gerd diet  Disc need for smoking cessaion Update if no imp        Other   Adjustment disorder with mixed anxiety and depressed mood    She stopped fluoxetine when school ended  I suspect some of her dis motivation and malaise may be from this  If no imp I rec she re start it  Reviewed stressors/ coping techniques/symptoms/ support sources/ tx options and side effects in detail  today       Dehydration - Primary    Out all day working and all eve Not enough fluids Nausea/ha/fatigue and malaise with some bowel change rec rest 2-3 days indoors/ aggressively hydrate to begin and then 64 oz fluids every day  Lab today      Relevant Orders   Comprehensive metabolic panel (Completed)   Heat exhaustion    Out all day working and all eve Not enough fluids Nausea/ha/fatigue and malaise with some bowel change rec rest 2-3 days indoors/ aggressively hydrate to begin and then 64 oz fluids every day  Lab today      Relevant Orders   CBC with Differential/Platelet (Completed)   Comprehensive metabolic panel (Completed)   Nausea    Suspect due to heat exhaustion with dehydration  and recent d/c of ssri or both  Also dizzy/malaise/fatigue  Spending all day and eve outdoors w/o drinking Urine is concentrated  inst to stay cool 2-3 d /rest and fluids Needs 64 oz or more of fluids per day  Update if not starting to improve in a week or if worsening   Lab today      Relevant Orders   CBC with Differential/Platelet (Completed)   Comprehensive metabolic panel (Completed)   POCT Urinalysis Dipstick (Automated) (Completed)   TOBACCO ABUSE    Disc in detail risks of smoking and possible outcomes including copd, vascular/ heart disease, cancer , respiratory and sinus infections  Pt voices understanding

## 2016-10-07 LAB — CBC WITH DIFFERENTIAL/PLATELET
BASOS ABS: 0.1 10*3/uL (ref 0.0–0.1)
Basophils Relative: 1.3 % (ref 0.0–3.0)
EOS ABS: 0.4 10*3/uL (ref 0.0–0.7)
Eosinophils Relative: 3.4 % (ref 0.0–5.0)
HCT: 40.1 % (ref 36.0–46.0)
HEMOGLOBIN: 13.4 g/dL (ref 12.0–15.0)
LYMPHS ABS: 3.3 10*3/uL (ref 0.7–4.0)
Lymphocytes Relative: 32.4 % (ref 12.0–46.0)
MCHC: 33.5 g/dL (ref 30.0–36.0)
MCV: 88.5 fl (ref 78.0–100.0)
MONO ABS: 0.7 10*3/uL (ref 0.1–1.0)
Monocytes Relative: 6.4 % (ref 3.0–12.0)
NEUTROS PCT: 56.5 % (ref 43.0–77.0)
Neutro Abs: 5.8 10*3/uL (ref 1.4–7.7)
Platelets: 371 10*3/uL (ref 150.0–400.0)
RBC: 4.53 Mil/uL (ref 3.87–5.11)
RDW: 13.1 % (ref 11.5–15.5)
WBC: 10.2 10*3/uL (ref 4.0–10.5)

## 2016-10-07 LAB — COMPREHENSIVE METABOLIC PANEL
ALBUMIN: 4.3 g/dL (ref 3.5–5.2)
ALK PHOS: 123 U/L — AB (ref 39–117)
ALT: 21 U/L (ref 0–35)
AST: 21 U/L (ref 0–37)
BUN: 9 mg/dL (ref 6–23)
CHLORIDE: 105 meq/L (ref 96–112)
CO2: 29 mEq/L (ref 19–32)
Calcium: 10 mg/dL (ref 8.4–10.5)
Creatinine, Ser: 0.82 mg/dL (ref 0.40–1.20)
GFR: 78.95 mL/min (ref >60.00)
GLUCOSE: 128 mg/dL — AB (ref 70–99)
POTASSIUM: 4.1 meq/L (ref 3.5–5.1)
SODIUM: 140 meq/L (ref 135–145)
TOTAL PROTEIN: 7.8 g/dL (ref 6.0–8.3)
Total Bilirubin: 0.4 mg/dL (ref 0.2–1.2)

## 2016-10-07 LAB — POC URINALSYSI DIPSTICK (AUTOMATED)
BILIRUBIN UA: NEGATIVE
Blood, UA: NEGATIVE
Glucose, UA: NEGATIVE
KETONES UA: NEGATIVE
LEUKOCYTES UA: NEGATIVE
Nitrite, UA: NEGATIVE
PROTEIN UA: NEGATIVE
Urobilinogen, UA: 0.2 E.U./dL
pH, UA: 6 (ref 5.0–8.0)

## 2016-10-07 NOTE — Assessment & Plan Note (Signed)
Out all day working and all eve Not enough fluids Nausea/ha/fatigue and malaise with some bowel change rec rest 2-3 days indoors/ aggressively hydrate to begin and then 64 oz fluids every day  Lab today

## 2016-10-07 NOTE — Assessment & Plan Note (Signed)
Disc in detail risks of smoking and possible outcomes including copd, vascular/ heart disease, cancer , respiratory and sinus infections  Pt voices understanding  

## 2016-10-07 NOTE — Assessment & Plan Note (Signed)
She stopped fluoxetine when school ended  I suspect some of her dis motivation and malaise may be from this  If no imp I rec she re start it  Reviewed stressors/ coping techniques/symptoms/ support sources/ tx options and side effects in detail today

## 2016-10-07 NOTE — Assessment & Plan Note (Addendum)
Suspect due to heat exhaustion with dehydration and recent d/c of ssri or both  Also dizzy/malaise/fatigue  Spending all day and eve outdoors w/o drinking Urine is concentrated  inst to stay cool 2-3 d /rest and fluids Needs 64 oz or more of fluids per day  Update if not starting to improve in a week or if worsening   Lab today

## 2016-10-07 NOTE — Assessment & Plan Note (Signed)
Continue prevacid Add zantac 75 otc bid prn breakthrough  Disc gerd diet  Disc need for smoking cessaion Update if no imp

## 2016-10-07 NOTE — Assessment & Plan Note (Signed)
Out all day working and all eve Not enough fluids Nausea/ha/fatigue and malaise with some bowel change rec rest 2-3 days indoors/ aggressively hydrate to begin and then 64 oz fluids every day  Lab today 

## 2016-10-21 ENCOUNTER — Other Ambulatory Visit (INDEPENDENT_AMBULATORY_CARE_PROVIDER_SITE_OTHER): Payer: BC Managed Care – PPO

## 2016-10-21 DIAGNOSIS — R7309 Other abnormal glucose: Secondary | ICD-10-CM

## 2016-10-21 DIAGNOSIS — R748 Abnormal levels of other serum enzymes: Secondary | ICD-10-CM

## 2016-10-21 LAB — ALKALINE PHOSPHATASE: ALK PHOS: 112 U/L (ref 39–117)

## 2016-10-21 LAB — HEMOGLOBIN A1C: HEMOGLOBIN A1C: 6 % (ref 4.6–6.5)

## 2016-12-04 ENCOUNTER — Other Ambulatory Visit: Payer: Self-pay | Admitting: Family Medicine

## 2016-12-06 NOTE — Telephone Encounter (Signed)
Will refill electronically  

## 2016-12-06 NOTE — Telephone Encounter (Signed)
Pt has had multiple acute appts., this was prescribed on 07/14/16, 2.155ml with 5 additional refills, please advise

## 2017-06-22 ENCOUNTER — Ambulatory Visit: Payer: BC Managed Care – PPO | Admitting: Family Medicine

## 2017-06-22 ENCOUNTER — Encounter: Payer: Self-pay | Admitting: Family Medicine

## 2017-06-22 DIAGNOSIS — K581 Irritable bowel syndrome with constipation: Secondary | ICD-10-CM | POA: Diagnosis not present

## 2017-06-22 MED ORDER — FLUOXETINE HCL 20 MG PO TABS: 30.0000 mg | ORAL_TABLET | Freq: Every day | ORAL | 11 refills | Status: DC

## 2017-06-22 NOTE — Patient Instructions (Signed)
Get miralax (store brand is fine) of miralax  Start with 3 doses a day as directed  When bowels start to move regularly- then cut back to once daily or as needed Increase fiber in diet  Increase fluid intake to 64 oz day (mostly water)   senekot is still ok as needed   Magnesium over the counter 250 to 500 mg once daily also helps   Alert me in 1 months if not a lot better !

## 2017-06-22 NOTE — Progress Notes (Signed)
Subjective:    Patient ID: Beth Reynolds, female    DOB: 01/07/1971, 47 y.o.   MRN: [id number]  HPI  Here for GI issues   Wt Readings from Last 3 Encounters:  06/22/17 173 lb 8 oz (78.7 kg)  10/06/16 163 lb 8 oz (74.2 kg)  08/11/16 161 lb 8 oz (73.3 kg)   27.38 kg/m    Stomach has been bothering her  Stays bloated and uncomfortable (with an occasional good day)  Hurts  Constipated --(has to take 2 senekot to have a bm)-more than once per week  Stools are hard to pass otherwise  No blood in her stool at all  Takes senekot and probiotic   No n/v   Does not drink alcohol  In general does not eat a lot  No particular food triggers  Not a whole lot of fiber  Dairy was not a problem (cottage cheese)  Coffee disagrees with her   GERD is under good control with prevacid   She takes children's mvi daily  No calcium  No tums   bk- bp and honey sandwich Lunch-popcorn Dinner is late- tries not to overeat  Chicken/cheese for protein   No periods since age 53    Patient Active Problem List   Diagnosis Date Noted  . Dehydration 10/06/2016  . Heat exhaustion 10/06/2016  . Nausea 10/06/2016  . Allergic conjunctivitis 07/14/2016  . Rash of hands 03/18/2014  . Gastritis 01/17/2012  . IBS (irritable bowel syndrome) 01/17/2012  . Hyperglycemia 10/09/2010  . Routine general medical examination at a health care facility 10/04/2010  . MENOPAUSE, EARLY 11/03/2009  . Menopausal and postmenopausal disorder 10/11/2009  . Migraine 01/19/2008  . TOBACCO ABUSE 07/17/2007  . Adjustment disorder with mixed anxiety and depressed mood 07/17/2007  . Allergic rhinitis 07/17/2007  . GERD 07/17/2007   Past Medical History:  Diagnosis Date  . Allergic rhinoconjunctivitis   . Anxiety   . Early menopause   . Migraine   . PMDD (premenstrual dysphoric disorder)   . TB (tuberculosis), treated   . Tobacco abuse    Past Surgical History:  Procedure Laterality Date  . DILATION  AND CURETTAGE OF UTERUS    . LESION REMOVAL     from finger   Social History   Tobacco Use  . Smoking status: Current Every Day Smoker    Packs/day: 0.50  . Smokeless tobacco: Never Used  Substance Use Topics  . Alcohol use: No    Alcohol/week: 0.0 oz  . Drug use: No   Family History  Problem Relation Age of Onset  . Diabetes Father    Allergies  Allergen Reactions  . Avocado     Swollen lips  . Paroxetine     REACTION: sever headache, not able to sleep and weight gain  . Pineapple     Swelling of lips  . Venlafaxine     REACTION: HA   Current Outpatient Medications on File Prior to Visit  Medication Sig Dispense Refill  . albuterol (PROVENTIL HFA;VENTOLIN HFA) 108 (90 Base) MCG/ACT inhaler Inhale 2 puffs into the lungs every 4 (four) hours as needed for wheezing. 1 Inhaler 0  . cetirizine (ZYRTEC) 10 MG tablet Take 10 mg by mouth daily as needed for allergies.    Marland Kitchen lansoprazole (PREVACID) 30 MG capsule TAKE 1 CAPSULE (30 MG TOTAL) BY MOUTH DAILY. 30 capsule 11   No current facility-administered medications on file prior to visit.     Review of Systems  Constitutional: Positive for fatigue. Negative for activity change, appetite change, fever and unexpected weight change.  HENT: Negative for congestion, ear pain, rhinorrhea, sinus pressure and sore throat.   Eyes: Negative for pain, redness and visual disturbance.  Respiratory: Negative for cough, shortness of breath and wheezing.   Cardiovascular: Negative for chest pain and palpitations.  Gastrointestinal: Positive for abdominal distention, abdominal pain and constipation. Negative for anal bleeding, blood in stool, diarrhea, nausea, rectal pain and vomiting.  Endocrine: Negative for polydipsia and polyuria.  Genitourinary: Negative for dysuria, frequency and urgency.  Musculoskeletal: Negative for arthralgias, back pain and myalgias.  Skin: Negative for pallor and rash.  Allergic/Immunologic: Negative for  environmental allergies.  Neurological: Negative for dizziness, tremors, syncope and headaches.  Hematological: Negative for adenopathy. Does not bruise/bleed easily.  Psychiatric/Behavioral: Negative for decreased concentration and dysphoric mood. The patient is not nervous/anxious.        Objective:   Physical Exam  Constitutional: She appears well-developed and well-nourished. No distress.  Well appearing   HENT:  Head: Normocephalic and atraumatic.  Mouth/Throat: Oropharynx is clear and moist.  Eyes: Pupils are equal, round, and reactive to light. Conjunctivae and EOM are normal. No scleral icterus.  Neck: Normal range of motion. Neck supple.  Cardiovascular: Normal rate, regular rhythm and normal heart sounds.  Pulmonary/Chest: Effort normal and breath sounds normal. No respiratory distress. She has no wheezes. She has no rales.  Abdominal: Soft. Bowel sounds are normal. She exhibits no distension, no abdominal bruit, no pulsatile midline mass and no mass. There is no hepatosplenomegaly. There is tenderness in the right lower quadrant and left lower quadrant. There is no rigidity, no rebound, no guarding, no CVA tenderness, no tenderness at McBurney's point and negative Murphy's sign.  Mild bilat LQ tenderness  Nl bs No M  Lymphadenopathy:    She has no cervical adenopathy.  Neurological: She is alert.  Skin: Skin is warm and dry. No erythema. No pallor.  Psychiatric: She has a normal mood and affect.          Assessment & Plan:   Problem List Items Addressed This Visit      Digestive   IBS (irritable bowel syndrome)    Constipation predominant with bloating and discomfort (reliant on senekot) Poor fluid intake and ? Fiber  Long disc re: fluid/lifestyle Inc to 64 oz per day  More fiber/ fruit/veg Exercise  miralax-begin with 3 doses per day and titrate as needed once bowels are moving  Magnesium 250-500 mg daily if helpful  Update if not starting to improve in a  week or if worsening   Consider px med like linzess or GI f/u if no imp  Will be due for screen colonoscopy at 48- would move up if needed

## 2017-06-23 NOTE — Assessment & Plan Note (Signed)
Constipation predominant with bloating and discomfort (reliant on senekot) Poor fluid intake and ? Fiber  Long disc re: fluid/lifestyle Inc to 64 oz per day  More fiber/ fruit/veg Exercise  miralax-begin with 3 doses per day and titrate as needed once bowels are moving  Magnesium 250-500 mg daily if helpful  Update if not starting to improve in a week or if worsening   Consider px med like linzess or GI f/u if no imp  Will be due for screen colonoscopy at 48- would move up if needed

## 2017-08-18 ENCOUNTER — Other Ambulatory Visit: Payer: Self-pay | Admitting: Family Medicine

## 2017-08-18 NOTE — Telephone Encounter (Signed)
Pt has had multiple acute appt but no recent f/u or CPE, please advise

## 2017-08-18 NOTE — Telephone Encounter (Signed)
Please schedule f/u or PE (her choice) in 6 mo and refill until then

## 2017-08-19 NOTE — Telephone Encounter (Signed)
Med filled and Lyla Son will reach out to pt to get med filled

## 2017-09-25 ENCOUNTER — Telehealth: Payer: Self-pay | Admitting: Family Medicine

## 2017-09-25 DIAGNOSIS — Z0000 Encounter for general adult medical examination without abnormal findings: Secondary | ICD-10-CM

## 2017-09-25 DIAGNOSIS — R739 Hyperglycemia, unspecified: Secondary | ICD-10-CM

## 2017-09-25 NOTE — Telephone Encounter (Signed)
-----   Message from Alvina Chouerri J Walsh sent at 09/21/2017 10:29 AM EDT ----- Regarding: Lab orders for Monday, 7.8.19 Patient is scheduled for CPX labs, please order future labs, Thanks , Camelia Engerri

## 2017-10-03 ENCOUNTER — Other Ambulatory Visit (INDEPENDENT_AMBULATORY_CARE_PROVIDER_SITE_OTHER): Payer: BC Managed Care – PPO

## 2017-10-03 DIAGNOSIS — R739 Hyperglycemia, unspecified: Secondary | ICD-10-CM

## 2017-10-03 DIAGNOSIS — Z0000 Encounter for general adult medical examination without abnormal findings: Secondary | ICD-10-CM | POA: Diagnosis not present

## 2017-10-03 LAB — CBC WITH DIFFERENTIAL/PLATELET
BASOS ABS: 0.1 10*3/uL (ref 0.0–0.1)
Basophils Relative: 1.2 % (ref 0.0–3.0)
EOS ABS: 0.3 10*3/uL (ref 0.0–0.7)
Eosinophils Relative: 4.2 % (ref 0.0–5.0)
HEMATOCRIT: 39.3 % (ref 36.0–46.0)
HEMOGLOBIN: 13.2 g/dL (ref 12.0–15.0)
LYMPHS PCT: 39.6 % (ref 12.0–46.0)
Lymphs Abs: 3.1 10*3/uL (ref 0.7–4.0)
MCHC: 33.6 g/dL (ref 30.0–36.0)
MCV: 87.8 fl (ref 78.0–100.0)
MONO ABS: 0.7 10*3/uL (ref 0.1–1.0)
Monocytes Relative: 9 % (ref 3.0–12.0)
Neutro Abs: 3.6 10*3/uL (ref 1.4–7.7)
Neutrophils Relative %: 46 % (ref 43.0–77.0)
Platelets: 380 10*3/uL (ref 150.0–400.0)
RBC: 4.48 Mil/uL (ref 3.87–5.11)
RDW: 13.5 % (ref 11.5–15.5)
WBC: 7.7 10*3/uL (ref 4.0–10.5)

## 2017-10-03 LAB — COMPREHENSIVE METABOLIC PANEL
ALT: 17 U/L (ref 0–35)
AST: 19 U/L (ref 0–37)
Albumin: 4.2 g/dL (ref 3.5–5.2)
Alkaline Phosphatase: 117 U/L (ref 39–117)
BUN: 10 mg/dL (ref 6–23)
CO2: 26 mEq/L (ref 19–32)
Calcium: 9.1 mg/dL (ref 8.4–10.5)
Chloride: 106 mEq/L (ref 96–112)
Creatinine, Ser: 0.71 mg/dL (ref 0.40–1.20)
GFR: 92.85 mL/min (ref >60.00)
GLUCOSE: 89 mg/dL (ref 70–99)
POTASSIUM: 3.8 meq/L (ref 3.5–5.1)
Sodium: 140 mEq/L (ref 135–145)
Total Bilirubin: 0.4 mg/dL (ref 0.2–1.2)
Total Protein: 7.3 g/dL (ref 6.0–8.3)

## 2017-10-03 LAB — TSH: TSH: 1.51 u[IU]/mL (ref 0.35–4.50)

## 2017-10-03 LAB — LIPID PANEL
Cholesterol: 151 mg/dL (ref 0–200)
HDL: 43.1 mg/dL (ref >39.00)
LDL Cholesterol: 73 mg/dL (ref 0–99)
NonHDL: 108.37
Total CHOL/HDL Ratio: 4
Triglycerides: 178 mg/dL — ABNORMAL HIGH (ref 0.0–149.0)
VLDL: 35.6 mg/dL (ref 0.0–40.0)

## 2017-10-03 LAB — HEMOGLOBIN A1C: HEMOGLOBIN A1C: 6.2 % (ref 4.6–6.5)

## 2017-10-07 ENCOUNTER — Encounter: Payer: Self-pay | Admitting: Family Medicine

## 2017-10-07 ENCOUNTER — Ambulatory Visit (INDEPENDENT_AMBULATORY_CARE_PROVIDER_SITE_OTHER): Payer: BC Managed Care – PPO | Admitting: Family Medicine

## 2017-10-07 VITALS — BP 128/84 | HR 81 | Temp 98.2°F | Ht 66.25 in | Wt 173.2 lb

## 2017-10-07 DIAGNOSIS — R7303 Prediabetes: Secondary | ICD-10-CM | POA: Diagnosis not present

## 2017-10-07 DIAGNOSIS — Z0000 Encounter for general adult medical examination without abnormal findings: Secondary | ICD-10-CM

## 2017-10-07 DIAGNOSIS — F17200 Nicotine dependence, unspecified, uncomplicated: Secondary | ICD-10-CM

## 2017-10-07 DIAGNOSIS — K219 Gastro-esophageal reflux disease without esophagitis: Secondary | ICD-10-CM | POA: Diagnosis not present

## 2017-10-07 DIAGNOSIS — F4323 Adjustment disorder with mixed anxiety and depressed mood: Secondary | ICD-10-CM | POA: Diagnosis not present

## 2017-10-07 MED ORDER — LANSOPRAZOLE 30 MG PO CPDR: DELAYED_RELEASE_CAPSULE | ORAL | 3 refills | Status: DC

## 2017-10-07 MED ORDER — FLUOXETINE HCL 20 MG PO TABS: 20.0000 mg | ORAL_TABLET | Freq: Every day | ORAL | 3 refills | Status: DC

## 2017-10-07 NOTE — Assessment & Plan Note (Signed)
Reviewed health habits including diet and exercise and skin cancer prevention Reviewed appropriate screening tests for age  Also reviewed health mt list, fam hx and immunization status , as well as social and family history   See HPI Labs rev  Good cholesterol  Disc DM prevention and imp of self care  Declines mammograms  Declines HIV screen Declines pap/gyn exam

## 2017-10-07 NOTE — Assessment & Plan Note (Signed)
Doing well with fluoxetine Reviewed stressors/ coping techniques/symptoms/ support sources/ tx options and side effects in detail today Continue this  Enc self care (overwhelmed at times/no time for self)

## 2017-10-07 NOTE — Assessment & Plan Note (Signed)
Continue lansoprazole  Diet-disc changes to help with reflux and bloating/ less refined carbs and spice

## 2017-10-07 NOTE — Patient Instructions (Addendum)
Try to get most of your carbohydrates from produce (with the exception of white potatoes)  Eat less bread/pasta/rice/snack foods/cereals/sweets and other items from the middle of the grocery store (processed carbs)  Exercise also helps prevent diabetes   Take care of yourself  Stay as active as you can be   If you want a mammogram let us know

## 2017-10-07 NOTE — Assessment & Plan Note (Signed)
Lab Results  Component Value Date   HGBA1C 6.2 10/03/2017   disc imp of low glycemic diet and wt loss to prevent DM2  Handouts given on prediabetes/diet/ DM prev  Will start by cutting down on added sugars

## 2017-10-07 NOTE — Assessment & Plan Note (Signed)
Disc in detail risks of smoking and possible outcomes including copd, vascular/ heart disease, cancer , respiratory and sinus infections  Pt voices understanding Pt is not motivated or ready to quit at this time No copd s/s

## 2017-10-07 NOTE — Progress Notes (Signed)
Subjective:    Patient ID: Beth Reynolds, female    DOB: 05-07-1970, 47 y.o.   MRN: [id number]  HPI  Here for health maintenance exam and to review chronic medical problems    Staying busy with school over the summer  Fair self care   Wt Readings from Last 3 Encounters:  10/07/17 173 lb 4 oz (78.6 kg)  06/22/17 173 lb 8 oz (78.7 kg)  10/06/16 163 lb 8 oz (74.2 kg)  does not exercise in addition to walking all day/cleaning  Diet -does not eat large amts but does not eating / convenience eating  27.75 kg/m   HIV screen -not interested/low risk   Pap 7/16 nl  Has not had one since  Does not want one  No gyn problems at all  No periods /menopausal  Heat intolerant- ok indoors with air conditioning    Mammogram -not interested  Self breast exam -no lumps , no changes   Tetanus shot 7/12   Smoking status - still 1/2 to 1ppd  No intention to quit yet  No breathing problems    BP Readings from Last 3 Encounters:  10/07/17 128/84  06/22/17 132/78  10/06/16 138/82   Pulse Readings from Last 3 Encounters:  10/07/17 81  06/22/17 82  10/06/16 94   Cholesterol  Lab Results  Component Value Date   CHOL 151 10/03/2017   CHOL 151 10/05/2010   Lab Results  Component Value Date   HDL 43.10 10/03/2017   HDL 42.50 10/05/2010   Lab Results  Component Value Date   LDLCALC 73 10/03/2017   LDLCALC 71 10/05/2010   Lab Results  Component Value Date   TRIG 178.0 (H) 10/03/2017   TRIG 188.0 (H) 10/05/2010   Lab Results  Component Value Date   CHOLHDL 4 10/03/2017   CHOLHDL 4 10/05/2010   No results found for: LDLDIRECT  Trig are mildly high   Hyperglycemia Lab Results  Component Value Date   HGBA1C 6.2 10/03/2017   Up from 6.1  Father had diabetes  She does add a lot of sugar to things   Patient Active Problem List   Diagnosis Date Noted  . Rash of hands 03/18/2014  . IBS (irritable bowel syndrome) 01/17/2012  . Prediabetes 10/09/2010  . Routine  general medical examination at a health care facility 10/04/2010  . MENOPAUSE, EARLY 11/03/2009  . Menopausal and postmenopausal disorder 10/11/2009  . Migraine 01/19/2008  . TOBACCO ABUSE 07/17/2007  . Adjustment disorder with mixed anxiety and depressed mood 07/17/2007  . Allergic rhinitis 07/17/2007  . GERD 07/17/2007   Past Medical History:  Diagnosis Date  . Allergic rhinoconjunctivitis   . Anxiety   . Early menopause   . Migraine   . PMDD (premenstrual dysphoric disorder)   . TB (tuberculosis), treated   . Tobacco abuse    Past Surgical History:  Procedure Laterality Date  . DILATION AND CURETTAGE OF UTERUS    . LESION REMOVAL     from finger   Social History   Tobacco Use  . Smoking status: Current Every Day Smoker    Packs/day: 0.50  . Smokeless tobacco: Never Used  Substance Use Topics  . Alcohol use: No    Alcohol/week: 0.0 oz  . Drug use: No   Family History  Problem Relation Age of Onset  . Diabetes Father    Allergies  Allergen Reactions  . Avocado     Swollen lips  . Paroxetine  REACTION: sever headache, not able to sleep and weight gain  . Pineapple     Swelling of lips  . Venlafaxine     REACTION: HA   Current Outpatient Medications on File Prior to Visit  Medication Sig Dispense Refill  . cetirizine (ZYRTEC) 10 MG tablet Take 10 mg by mouth daily as needed for allergies.     No current facility-administered medications on file prior to visit.      Review of Systems  Constitutional: Positive for fatigue. Negative for activity change, appetite change, fever and unexpected weight change.  HENT: Negative for congestion, ear pain, rhinorrhea, sinus pressure and sore throat.   Eyes: Negative for pain, redness and visual disturbance.  Respiratory: Negative for cough, shortness of breath and wheezing.   Cardiovascular: Negative for chest pain and palpitations.  Gastrointestinal: Negative for abdominal pain, blood in stool, constipation and  diarrhea.  Endocrine: Negative for polydipsia and polyuria.  Genitourinary: Negative for dysuria, frequency and urgency.  Musculoskeletal: Negative for arthralgias, back pain and myalgias.  Skin: Negative for pallor and rash.  Allergic/Immunologic: Negative for environmental allergies.  Neurological: Negative for dizziness, syncope and headaches.  Hematological: Negative for adenopathy. Does not bruise/bleed easily.  Psychiatric/Behavioral: Negative for decreased concentration and dysphoric mood. The patient is not nervous/anxious.        Stressors        Objective:   Physical Exam  Constitutional: She appears well-developed and well-nourished. No distress.  Well appearing   HENT:  Head: Normocephalic and atraumatic.  Right Ear: External ear normal.  Left Ear: External ear normal.  Mouth/Throat: Oropharynx is clear and moist.  Eyes: Pupils are equal, round, and reactive to light. Conjunctivae and EOM are normal. No scleral icterus.  Neck: Normal range of motion. Neck supple. No JVD present. Carotid bruit is not present. No thyromegaly present.  Cardiovascular: Normal rate, regular rhythm, normal heart sounds and intact distal pulses. Exam reveals no gallop.  Pulmonary/Chest: Effort normal and breath sounds normal. No respiratory distress. She has no wheezes. She exhibits no tenderness. No breast tenderness, discharge or bleeding.  Diffusely distant bs   Abdominal: Soft. Bowel sounds are normal. She exhibits no distension, no abdominal bruit and no mass. There is no tenderness.  Genitourinary: No breast tenderness, discharge or bleeding.  Genitourinary Comments: Breast exam: No mass, nodules, thickening, tenderness, bulging, retraction, inflamation, nipple discharge or skin changes noted.  No axillary or clavicular LA.      Musculoskeletal: Normal range of motion. She exhibits no edema or tenderness.  Lymphadenopathy:    She has no cervical adenopathy.  Neurological: She is alert. She  has normal reflexes. She displays normal reflexes. No cranial nerve deficit. She exhibits normal muscle tone. Coordination normal.  Skin: Skin is warm and dry. No rash noted. No erythema. No pallor.  Few lentigines   Psychiatric: She has a normal mood and affect.  Pleasant and talkative           Assessment & Plan:   Problem List Items Addressed This Visit      Digestive   GERD    Continue lansoprazole  Diet-disc changes to help with reflux and bloating/ less refined carbs and spice        Relevant Medications   lansoprazole (PREVACID) 30 MG capsule     Other   Adjustment disorder with mixed anxiety and depressed mood    Doing well with fluoxetine Reviewed stressors/ coping techniques/symptoms/ support sources/ tx options and side effects in detail  today Continue this  Enc self care (overwhelmed at times/no time for self)      Prediabetes    Lab Results  Component Value Date   HGBA1C 6.2 10/03/2017   disc imp of low glycemic diet and wt loss to prevent DM2  Handouts given on prediabetes/diet/ DM prev  Will start by cutting down on added sugars       Routine general medical examination at a health care facility - Primary    Reviewed health habits including diet and exercise and skin cancer prevention Reviewed appropriate screening tests for age  Also reviewed health mt list, fam hx and immunization status , as well as social and family history   See HPI Labs rev  Good cholesterol  Disc DM prevention and imp of self care  Declines mammograms  Declines HIV screen Declines pap/gyn exam       TOBACCO ABUSE    Disc in detail risks of smoking and possible outcomes including copd, vascular/ heart disease, cancer , respiratory and sinus infections  Pt voices understanding Pt is not motivated or ready to quit at this time No copd s/s

## 2018-01-30 ENCOUNTER — Ambulatory Visit: Payer: BC Managed Care – PPO | Admitting: Family Medicine

## 2018-02-06 ENCOUNTER — Ambulatory Visit: Payer: BC Managed Care – PPO | Admitting: Family Medicine

## 2018-02-06 ENCOUNTER — Encounter: Payer: Self-pay | Admitting: Family Medicine

## 2018-02-06 ENCOUNTER — Other Ambulatory Visit: Payer: Self-pay | Admitting: *Deleted

## 2018-02-06 VITALS — BP 140/80 | HR 89 | Temp 97.9°F | Ht 66.25 in | Wt 173.5 lb

## 2018-02-06 DIAGNOSIS — R1013 Epigastric pain: Secondary | ICD-10-CM | POA: Diagnosis not present

## 2018-02-06 DIAGNOSIS — J209 Acute bronchitis, unspecified: Secondary | ICD-10-CM | POA: Diagnosis not present

## 2018-02-06 DIAGNOSIS — M545 Low back pain, unspecified: Secondary | ICD-10-CM

## 2018-02-06 DIAGNOSIS — R030 Elevated blood-pressure reading, without diagnosis of hypertension: Secondary | ICD-10-CM | POA: Diagnosis not present

## 2018-02-06 DIAGNOSIS — F17200 Nicotine dependence, unspecified, uncomplicated: Secondary | ICD-10-CM

## 2018-02-06 DIAGNOSIS — I10 Essential (primary) hypertension: Secondary | ICD-10-CM | POA: Insufficient documentation

## 2018-02-06 NOTE — Assessment & Plan Note (Signed)
Disc in detail risks of smoking and possible outcomes including copd, vascular/ heart disease, cancer , respiratory and sinus infections  Pt voices understanding Stressed the role of smoking with infections  Strongly enc to quit-she is contemplating it

## 2018-02-06 NOTE — Assessment & Plan Note (Signed)
Epigastric discomfort and burning as well as nausea Strongly suspect due to prednisone (less likely doxy)  Disc taking meds on a full stomach Only one day left Continue prevacid daily  Update if not starting to improve in a week or if worsening

## 2018-02-06 NOTE — Assessment & Plan Note (Signed)
Ongoing R lumbar muscular pain  No bony tenderness Neg enuro exam  Disc stretching and use of heat  Handout given re: rehab program  Update if not starting to improve in a week or if worsening

## 2018-02-06 NOTE — Assessment & Plan Note (Signed)
Clinically improved with doxycycline/prednisone and guiafen-codeine  Enc strongly to quit smoking  Re assuring exam   Some poss side eff to prednisone-dyspepsia/anxiety and inc bp  Will continue to monitor -prednisone ia almost done

## 2018-02-06 NOTE — Assessment & Plan Note (Addendum)
Improved with prednisone /doxycycline and guaifen-cod cough med Strongly enc to quit smoking  Reassuring exam  Update if not starting to improve in a week or if worsening

## 2018-02-06 NOTE — Progress Notes (Signed)
Subjective:    Patient ID: Beth Reynolds, female    DOB: [birth year], 47 y.o.   MRN: [id number]  HPI Here for cough (seen at [facility] last Monday)  Cough was causing back pain  She was px prednisone and guifen-codeine and doxycycline  Cough is improving  No more tightness in chest No longer wheezing  Still very tired   Some dizziness and nausea and stomach ache  More anxious- ? Due to prednisone    Smoking status knows she needs to quit  avg 1/2 ppd    bp elevation At [facility] was 149/90 BP Readings from Last 3 Encounters:  02/06/18 140/80  10/07/17 128/84  06/22/17 132/78   At [facility] she checked it - 158/90    Patient Active Problem List   Diagnosis Date Noted  . Acute bronchitis 02/06/2018  . Dyspepsia 02/06/2018  . Low back pain 02/06/2018  . Elevated blood pressure reading 02/06/2018  . Rash of hands 03/18/2014  . IBS (irritable bowel syndrome) 01/17/2012  . Prediabetes 10/09/2010  . Routine general medical examination at a health care facility 10/04/2010  . MENOPAUSE, EARLY 11/03/2009  . Menopausal and postmenopausal disorder 10/11/2009  . Migraine 01/19/2008  . TOBACCO ABUSE 07/17/2007  . Adjustment disorder with mixed anxiety and depressed mood 07/17/2007  . Allergic rhinitis 07/17/2007  . GERD 07/17/2007   Past Medical History:  Diagnosis Date  . Allergic rhinoconjunctivitis   . Anxiety   . Early menopause   . Migraine   . PMDD (premenstrual dysphoric disorder)   . TB (tuberculosis), treated   . Tobacco abuse    Past Surgical History:  Procedure Laterality Date  . DILATION AND CURETTAGE OF UTERUS    . LESION REMOVAL     from finger   Social History   Tobacco Use  . Smoking status: Current Every Day Smoker    Packs/day: 0.50  . Smokeless tobacco: Never Used  Substance Use Topics  . Alcohol use: No    Alcohol/week: 0.0 standard drinks  . Drug use: No   Family History  Problem Relation Age of Onset  . Diabetes Father    Allergies  Allergen  Reactions  . Avocado     Swollen lips  . Paroxetine     REACTION: sever headache, not able to sleep and weight gain  . Pineapple     Swelling of lips  . Venlafaxine     REACTION: HA   Current Outpatient Medications on File Prior to Visit  Medication Sig Dispense Refill  . cetirizine (ZYRTEC) 10 MG tablet Take 10 mg by mouth daily as needed for allergies.    Marland Kitchen FLUoxetine (PROZAC) 20 MG tablet Take 1 tablet (20 mg total) by mouth daily. 90 tablet 3  . lansoprazole (PREVACID) 30 MG capsule TAKE 1 CAPSULE (30 MG TOTAL) BY MOUTH DAILY. 90 capsule 3   No current facility-administered medications on file prior to visit.      Review of Systems  Constitutional: Positive for fatigue. Negative for activity change, appetite change, fever and unexpected weight change.  HENT: Negative for congestion, ear pain, rhinorrhea, sinus pressure and sore throat.   Eyes: Negative for pain, redness and visual disturbance.  Respiratory: Positive for cough. Negative for shortness of breath and wheezing.   Cardiovascular: Negative for chest pain and palpitations.  Gastrointestinal: Positive for abdominal pain and nausea. Negative for blood in stool, constipation, diarrhea and vomiting.  Endocrine: Negative for polydipsia and polyuria.  Genitourinary: Negative for dysuria, frequency and urgency.  Musculoskeletal: Positive for back pain. Negative for arthralgias and myalgias.  Skin: Negative for pallor and rash.  Allergic/Immunologic: Negative for environmental allergies.  Neurological: Positive for dizziness. Negative for tremors, seizures, syncope, facial asymmetry, speech difficulty, weakness, light-headedness, numbness and headaches.  Hematological: Negative for adenopathy. Does not bruise/bleed easily.  Psychiatric/Behavioral: Negative for decreased concentration and dysphoric mood. The patient is nervous/anxious.        Objective:   Physical Exam  Constitutional: She appears well-developed and  well-nourished. No distress.  Well appearing  HENT:  Head: Normocephalic and atraumatic.  Right Ear: External ear normal.  Left Ear: External ear normal.  Nose: Nose normal.  Mouth/Throat: Oropharynx is clear and moist.  Nares are boggy and slt contested No sinus tenderness  Eyes: Pupils are equal, round, and reactive to light. Conjunctivae and EOM are normal. Right eye exhibits no discharge. Left eye exhibits no discharge. No scleral icterus.  Neck: Normal range of motion. Neck supple. No JVD present. Carotid bruit is not present. No thyromegaly present.  Cardiovascular: Normal rate, regular rhythm, normal heart sounds and intact distal pulses. Exam reveals no gallop.  Pulmonary/Chest: Effort normal and breath sounds normal. No respiratory distress. She has no wheezes. She has no rales.  Diffusely distant bs Scant wheeze only on forced exp   Abdominal: Soft. Bowel sounds are normal. She exhibits no distension and no mass. There is tenderness. There is no rebound and no guarding. No hernia.  Musculoskeletal: She exhibits no edema or tenderness.  Tender R peri lumbar musculature with spasm No bony tenderness SLR pos on R for back and buttock pain   Lymphadenopathy:    She has no cervical adenopathy.  Neurological: She is alert. She has normal reflexes. She displays normal reflexes. No cranial nerve deficit. She exhibits normal muscle tone. Coordination normal.  Skin: Skin is warm and dry. No rash noted. No erythema. No pallor.  Psychiatric: Her mood appears anxious.  Mildly anxious  Pleasant           Assessment & Plan:   Problem List Items Addressed This Visit      Respiratory   Acute bronchitis - Primary    Improved with prednisone /doxycycline and guaifen-cod cough med Strongly enc to quit smoking  Reassuring exam  Update if not starting to improve in a week or if worsening          Other   Dyspepsia    Epigastric discomfort and burning as well as nausea Strongly  suspect due to prednisone (less likely doxy)  Disc taking meds on a full stomach Only one day left Continue prevacid daily  Update if not starting to improve in a week or if worsening        Elevated blood pressure reading    Prev when sick and now on prednisone BP: 140/80  Disc lifestyle change for bp / DASH eating  Also smoking cessation and exercise  Motivated to control this  F.u in 2 months approx      Low back pain    Ongoing R lumbar muscular pain  No bony tenderness Neg enuro exam  Disc stretching and use of heat  Handout given re: rehab program  Update if not starting to improve in a week or if worsening        TOBACCO ABUSE    Disc in detail risks of smoking and possible outcomes including copd, vascular/ heart disease, cancer , respiratory and sinus infections  Pt voices understanding Stressed the role  of smoking with infections  Strongly enc to quit-she is contemplating it

## 2018-02-06 NOTE — Patient Instructions (Addendum)
Use heat on your back  Try the exercises  If no further improvement in a week let me know -I will refer you to physical therapy   The prednisone is increasing blood pressure more  Processed food is loaded with sodium  Look at the DASH eating plan  Exercise  Smoking cessation   Follow up in 2 months for a visit for elevated blood pressure   I think your stomach issues will calm down once the prednisone is out of your system  Take what you have left with food   Please consider quitting smoking  I think nicotine patches are a good idea

## 2018-02-06 NOTE — Assessment & Plan Note (Signed)
Prev when sick and now on prednisone BP: 140/80  Disc lifestyle change for bp / DASH eating  Also smoking cessation and exercise  Motivated to control this  F.u in 2 months approx

## 2018-04-11 ENCOUNTER — Ambulatory Visit: Payer: BC Managed Care – PPO | Admitting: Family Medicine

## 2018-11-17 ENCOUNTER — Other Ambulatory Visit: Payer: Self-pay

## 2018-11-17 ENCOUNTER — Ambulatory Visit (INDEPENDENT_AMBULATORY_CARE_PROVIDER_SITE_OTHER): Payer: BC Managed Care – PPO | Admitting: Family Medicine

## 2018-11-17 ENCOUNTER — Ambulatory Visit: Payer: BC Managed Care – PPO | Admitting: Family Medicine

## 2018-11-17 ENCOUNTER — Encounter: Payer: Self-pay | Admitting: Family Medicine

## 2018-11-17 DIAGNOSIS — J069 Acute upper respiratory infection, unspecified: Secondary | ICD-10-CM | POA: Diagnosis not present

## 2018-11-17 DIAGNOSIS — F17200 Nicotine dependence, unspecified, uncomplicated: Secondary | ICD-10-CM | POA: Diagnosis not present

## 2018-11-17 MED ORDER — FLUOXETINE HCL 20 MG PO TABS: 20.0000 mg | ORAL_TABLET | Freq: Every day | ORAL | 1 refills | Status: DC

## 2018-11-17 NOTE — Progress Notes (Signed)
Virtual Visit via Video Note  I connected with Beth Reynolds on 11/17/18 at  3:45 PM EDT by a video enabled telemedicine application and verified that I am speaking with the correct person using two identifiers.  Location: Patient: home Provider: office    I discussed the limitations of evaluation and management by telemedicine and the availability of in person appointments. The patient expressed understanding and agreed to proceed.  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 URI (upper respiratory infection) 11/17/2018  . Acute bronchitis 02/06/2018  . Dyspepsia 02/06/2018  . Low back pain 02/06/2018  . Elevated blood pressure reading 02/06/2018  . Rash of hands 03/18/2014  . IBS (irritable bowel syndrome) 01/17/2012  . Prediabetes 10/09/2010  . Routine general medical examination at a health care facility 10/04/2010  . MENOPAUSE, EARLY 11/03/2009  . Menopausal and postmenopausal disorder 10/11/2009  . Migraine 01/19/2008  . TOBACCO ABUSE 07/17/2007  . Adjustment disorder with mixed anxiety and depressed mood 07/17/2007  . Allergic rhinitis 07/17/2007  . GERD 07/17/2007   Past Medical History:  Diagnosis Date  . Allergic rhinoconjunctivitis   . Anxiety   . Early menopause   . Migraine   . PMDD (premenstrual dysphoric disorder)   . TB (tuberculosis), treated   .  Tobacco abuse    Past Surgical History:  Procedure Laterality Date  . DILATION AND CURETTAGE OF UTERUS    . LESION REMOVAL     from finger   Social History   Tobacco Use  . Smoking status: Current Every Day Smoker    Packs/day: 0.50  . Smokeless tobacco: Never Used  Substance Use Topics  . Alcohol use: No    Alcohol/week: 0.0 standard drinks  . Drug use: No   Family History  Problem Relation Age of Onset  . Diabetes Father    Allergies  Allergen Reactions  . Avocado     Swollen lips  . Paroxetine     REACTION: sever headache, not able to sleep and weight gain  . Pineapple     Swelling of lips  . Venlafaxine     REACTION: HA   Current Outpatient Medications on File Prior to Visit  Medication Sig Dispense Refill  . albuterol (PROVENTIL HFA;VENTOLIN HFA) 108 (90 Base) MCG/ACT inhaler INHALE 2 INHALATIONS INTO THE LUNGS EVERY 6 (SIX) HOURS AS NEEDED  0  . cetirizine (ZYRTEC) 10 MG tablet Take 10 mg by mouth daily as needed for allergies.    Marland Kitchen lansoprazole (PREVACID) 30 MG capsule TAKE 1 CAPSULE (30 MG TOTAL) BY MOUTH DAILY. 90 capsule 3   No current facility-administered medications on file prior to visit.      Review of Systems  Constitutional: Positive for malaise/fatigue. Negative for chills and fever.  HENT: Positive for sore throat. Negative for ear discharge, ear pain and sinus pain.   Eyes: Negative for discharge and redness.  Respiratory: Positive for cough. Negative for sputum production and shortness of breath.   Cardiovascular: Negative for chest pain and palpitations.  Gastrointestinal: Negative for abdominal pain, diarrhea, nausea and vomiting.  Genitourinary: Negative for dysuria.  Musculoskeletal: Positive for myalgias.  Skin: Negative for rash.  Neurological: Negative for headaches.    Observations/Objective: When able to see her-pt looked like her normal self, not distressed but fatigued  Sounds slightly hoarse  Coughed once- dry  No wheezing or  shortness of breath  Nl cognition and mood  No visible facial swelling or rash  Assessment and Plan: Problem List Items Addressed This Visit      Respiratory   URI (upper respiratory infection)    With scratchy throat and cough /mild hoarseness Recommended symptomatic care-robitussin dm and lozenges and tylenol  She will continue to watch temp covid 19 test ordered to get Monday am [facility] [address] location  If worse in the meantime will call or go to ER if needed       Relevant Orders   Novel Coronavirus, NAA ([facility])       Follow Up Instructions: Rest and drink fluids  Get tested for covid on Monday at [facility] between 8 am and 3:30 pm (drive through) Stay home from both jobs until test results come back negative and symptoms are better.  Robitussin dm is good for cough  Lozenges are ok  Tylenol for aches or fever  Keep track of your temperature If symptoms worsen alert us (if severe go to the ER)  Watch for worse cough/shortness of breath/severe sore throat/high fever/loss or taste or smell and GI symptoms like diarrhea or vomiting   Keep us posted We will contact you when test results return    I discussed the assessment and treatment plan with the patient. The patient was provided an opportunity to ask questions and all were answered. The patient agreed with the plan and demonstrated an understanding of the instructions.   The patient was advised to call back or seek an in-person evaluation if the symptoms worsen or if the condition fails to improve as anticipated.     Roxy MannsMarne Verdella Laidlaw, MD

## 2018-11-17 NOTE — Patient Instructions (Signed)
Rest and drink fluids  Get tested for covid on Monday at [facility] [address] between 8 am and 3:30 pm (drive through) Stay home from both jobs until test results come back negative and symptoms are better.  Robitussin dm is good for cough  Lozenges are ok  Tylenol for aches or fever  Keep track of your temperature If symptoms worsen alert [place] (if severe go to the ER)  Watch for worse cough/shortness of breath/severe sore throat/high fever/loss or taste or smell and GI symptoms like diarrhea or vomiting   Keep [place] posted We will contact you when test results return

## 2018-11-19 ENCOUNTER — Encounter: Payer: Self-pay | Admitting: Family Medicine

## 2018-11-19 NOTE — Assessment & Plan Note (Signed)
With scratchy throat and cough /mild hoarseness Recommended symptomatic care-robitussin dm and lozenges and tylenol  She will continue to watch temp covid 19 test ordered to get Monday am at the [facility] [address] location  If worse in the meantime will call or go to ER if needed

## 2018-11-19 NOTE — Assessment & Plan Note (Signed)
This puts her at more risk from uri and risk for covid  Enc cessation

## 2018-11-20 ENCOUNTER — Other Ambulatory Visit: Payer: Self-pay

## 2018-11-20 DIAGNOSIS — Z20822 Contact with and (suspected) exposure to covid-19: Secondary | ICD-10-CM

## 2018-11-21 LAB — NOVEL CORONAVIRUS, NAA: SARS-CoV-2, NAA: NOT DETECTED

## 2018-11-28 ENCOUNTER — Other Ambulatory Visit: Payer: Self-pay | Admitting: *Deleted

## 2018-11-28 MED ORDER — LANSOPRAZOLE 30 MG PO CPDR: DELAYED_RELEASE_CAPSULE | ORAL | 0 refills | Status: DC

## 2018-12-08 ENCOUNTER — Encounter: Payer: Self-pay | Admitting: Family Medicine

## 2018-12-08 ENCOUNTER — Ambulatory Visit (INDEPENDENT_AMBULATORY_CARE_PROVIDER_SITE_OTHER): Payer: BC Managed Care – PPO | Admitting: Family Medicine

## 2018-12-08 VITALS — BP 146/85 | HR 85 | Temp 98.7°F

## 2018-12-08 DIAGNOSIS — J069 Acute upper respiratory infection, unspecified: Secondary | ICD-10-CM | POA: Diagnosis not present

## 2018-12-08 DIAGNOSIS — F17200 Nicotine dependence, unspecified, uncomplicated: Secondary | ICD-10-CM

## 2018-12-08 MED ORDER — BENZONATATE 200 MG PO CAPS: 200.0000 mg | ORAL_CAPSULE | Freq: Three times a day (TID) | ORAL | 1 refills | Status: DC | PRN

## 2018-12-08 MED ORDER — GUAIFENESIN-CODEINE 100-10 MG/5ML PO SYRP: 5.0000 mL | ORAL_SOLUTION | Freq: Three times a day (TID) | ORAL | 0 refills | Status: DC | PRN

## 2018-12-08 MED ORDER — PREDNISONE 10 MG PO TABS: ORAL_TABLET | ORAL | 0 refills | Status: DC

## 2018-12-08 NOTE — Progress Notes (Signed)
Virtual Visit via Video Note  I connected with Beth Reynolds on 12/08/18 at  3:00 PM EDT by a video enabled telemedicine application and verified that I am speaking with the correct person using two identifiers.  Location: Patient: home Provider: office    I discussed the limitations of evaluation and management by telemedicine and the availability of in person appointments. The patient expressed understanding and agreed to proceed.  History of Present Illness: Was doing ok until Wednesday   Then congestion  Sinus pressure to begin with -now running  Snotty nose (clear)  Now chest is scratchy /burning with a bad cough - no production  Burning in mid chest but not wheezing or tight   No fever  Feels cold on and off   Throat hurts to cough  No headache  Not dizzy  No change in taste or smell    smoking status - not as much  avg 1/2ppd before that   Had covid test 8/24 neg Was better on 8/27   Family members -having recurring uri  Grandchild -has fever/congestion -she was negative   Has albuterol -has not needed it yet  Taking tylenol cold and cough  Robitussin  Sinus /congestion pills   Drinking lots of fluids   Has not been to work since her test came back neg -working from home   Patient Active Problem List   Diagnosis Date Noted  . URI (upper respiratory infection) 11/17/2018  . Dyspepsia 02/06/2018  . Low back pain 02/06/2018  . Elevated blood pressure reading 02/06/2018  . Rash of hands 03/18/2014  . IBS (irritable bowel syndrome) 01/17/2012  . Prediabetes 10/09/2010  . Routine general medical examination at a health care facility 10/04/2010  . MENOPAUSE, EARLY 11/03/2009  . Menopausal and postmenopausal disorder 10/11/2009  . Migraine 01/19/2008  . TOBACCO ABUSE 07/17/2007  . Adjustment disorder with mixed anxiety and depressed mood 07/17/2007  . Allergic rhinitis 07/17/2007  . GERD 07/17/2007   Past Medical History:  Diagnosis Date  . Allergic  rhinoconjunctivitis   . Anxiety   . Early menopause   . Migraine   . PMDD (premenstrual dysphoric disorder)   . TB (tuberculosis), treated   . Tobacco abuse    Past Surgical History:  Procedure Laterality Date  . DILATION AND CURETTAGE OF UTERUS    . LESION REMOVAL     from finger   Social History   Tobacco Use  . Smoking status: Current Every Day Smoker    Packs/day: 0.50  . Smokeless tobacco: Never Used  Substance Use Topics  . Alcohol use: No    Alcohol/week: 0.0 standard drinks  . Drug use: No   Family History  Problem Relation Age of Onset  . Diabetes Father    Allergies  Allergen Reactions  . Avocado     Swollen lips  . Paroxetine     REACTION: sever headache, not able to sleep and weight gain  . Pineapple     Swelling of lips  . Venlafaxine     REACTION: HA   Current Outpatient Medications on File Prior to Visit  Medication Sig Dispense Refill  . albuterol (PROVENTIL HFA;VENTOLIN HFA) 108 (90 Base) MCG/ACT inhaler INHALE 2 INHALATIONS INTO THE LUNGS EVERY 6 (SIX) HOURS AS NEEDED  0  . cetirizine (ZYRTEC) 10 MG tablet Take 10 mg by mouth daily as needed for allergies.    Marland Kitchen. FLUoxetine (PROZAC) 20 MG tablet Take 1 tablet (20 mg total) by mouth daily. 90 tablet  1  . lansoprazole (PREVACID) 30 MG capsule TAKE 1 CAPSULE (30 MG TOTAL) BY MOUTH DAILY. 90 capsule 0   No current facility-administered medications on file prior to visit.   Review of Systems  Constitutional: Positive for malaise/fatigue. Negative for chills, diaphoresis and fever.  HENT: Positive for congestion and sore throat. Negative for ear discharge, ear pain and sinus pain.   Eyes: Negative for discharge and redness.  Respiratory: Positive for cough. Negative for sputum production, shortness of breath and wheezing.   Cardiovascular: Negative for chest pain and palpitations.  Gastrointestinal: Negative for abdominal pain, diarrhea, nausea and vomiting.  Musculoskeletal: Negative for myalgias.   Skin: Negative for rash.  Neurological: Negative for dizziness and headaches.     Observations/Objective: Patient appears well, in no distress Weight is baseline -overwtigh  No facial swelling or asymmetry Voice is hoarse Pt is sniffling  No obvious tremor or mobility impairment Moving neck and UEs normally Able to hear the call well  Dry cough during interview, not short of breath  Talkative and mentally sharp with no cognitive changes No skin changes on face or neck , no rash or pallor Affect is normal    Assessment and Plan: Problem List Items Addressed This Visit      Respiratory   URI (upper respiratory infection) - Primary    Unsure if new or re occurrence Was tested neg for covid (low threshold to test again if worse) Robitussin ac px with caution of sedation Also tessalon Prednisone for tight/burning chest in smoker-watch for wheezing  Albuterol as needed Rest and fluids Close obs  Enc to quit smoking again Update if not starting to improve in a week or if worsening          Other   TOBACCO ABUSE    Disc in detail risks of smoking and possible outcomes including copd, vascular/ heart disease, cancer , respiratory and sinus infections  Pt voices understanding           Follow Up Instructions: Try tessalon and robitussin ac (caution of sedation) for cough Rest and drink fluids Take prednisone for chest tightness/watch for wheezing  Please consider quitting smoking  If no improvement - please let us know so we can get you tested for covid again  Update if not starting to improve in a week or if worsening  -esp if worse cough or fever or shortness of breath   I discussed the assessment and treatment plan with the patient. The patient was provided an opportunity to ask questions and all were answered. The patient agreed with the plan and demonstrated an understanding of the instructions.   The patient was advised to call back or seek an in-person  evaluation if the symptoms worsen or if the condition fails to improve as anticipated.     Loura Pardon, MD

## 2018-12-10 ENCOUNTER — Encounter: Payer: Self-pay | Admitting: Family Medicine

## 2018-12-10 NOTE — Patient Instructions (Signed)
Try tessalon and robitussin ac (caution of sedation) for cough Rest and drink fluids Take prednisone for chest tightness/watch for wheezing  Please consider quitting smoking  If no improvement - please let us know so we can get you tested for covid again  Update if not starting to improve in a week or if worsening  -esp if worse cough or fever or shortness of breath

## 2018-12-10 NOTE — Assessment & Plan Note (Signed)
Disc in detail risks of smoking and possible outcomes including copd, vascular/ heart disease, cancer , respiratory and sinus infections  Pt voices understanding  

## 2018-12-10 NOTE — Assessment & Plan Note (Signed)
Unsure if new or re occurrence Was tested neg for covid (low threshold to test again if worse) Robitussin ac px with caution of sedation Also tessalon Prednisone for tight/burning chest in smoker-watch for wheezing  Albuterol as needed Rest and fluids Close obs  Enc to quit smoking again Update if not starting to improve in a week or if worsening

## 2018-12-11 ENCOUNTER — Telehealth: Payer: Self-pay

## 2018-12-11 MED ORDER — ALBUTEROL SULFATE HFA 108 (90 BASE) MCG/ACT IN AERS: INHALATION_SPRAY | RESPIRATORY_TRACT | 5 refills | Status: DC

## 2018-12-11 MED ORDER — ALBUTEROL SULFATE HFA 108 (90 BASE) MCG/ACT IN AERS: INHALATION_SPRAY | RESPIRATORY_TRACT | 1 refills | Status: DC

## 2018-12-11 NOTE — Telephone Encounter (Signed)
I sent albuterol   Expectorant is otc -guaifenesin (mucinex or robitussin) as directed with lots of water   Let me know if not improving later this week

## 2018-12-11 NOTE — Telephone Encounter (Signed)
Pt said she is using the robitussin-AC that Dr. Glori Bickers sent in she said it's guaifenesin and it's not helping she said it suppresses her cough but doesn't help get the "phlegm out of her lungs" that she's having a hard time coughing up, pt wants to know what else should she do since she's already taking the med Dr. Glori Bickers suggested in her last message

## 2018-12-11 NOTE — Telephone Encounter (Signed)
She can try breathing some steam , also drinking lots of fluids  It sounds like we should probably have her evaluated at [facility] - I think someone needs to examine her since we cannot have her in here (she may or may not need a chest xray)  If not improving tomorrow-I would recommend [facility] or one closer to her if able  If short of breath after hours-ER

## 2018-12-11 NOTE — Telephone Encounter (Signed)
Pt request refill albuterol; pt thought had med at home but was empty.pt had virtual visit on 12/08/18. Refilled per protocol to [facility]. Pt wants cough expectorant instead of suppressant; pt said has a lot of phlegm in lungs; pt presently has non prod cough but can feel the mucus in her lungs. Pt request cb.

## 2018-12-12 NOTE — Telephone Encounter (Signed)
Pt notified of Dr. Marliss Coots comments and instructions and verbalized understanding. Pt said she will go to an UC

## 2019-01-02 ENCOUNTER — Telehealth: Payer: Self-pay

## 2019-01-02 NOTE — Telephone Encounter (Signed)
I saw her virtually for respiratory symptoms on 12/08/18 She worsened/did not improve and was told to go to UC on 9/14  Did she go?  Did she have covid testing or cxr or get diagnosed with something?   How are her symptoms now ?  I am concerned if she is still symptomatic

## 2019-01-02 NOTE — Telephone Encounter (Signed)
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 Pt request cb.

## 2019-01-02 NOTE — Telephone Encounter (Signed)
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  If she feels she has some sort of reaction to the school building (allergic?) she should bring that up with her administration.   She may need allergy testing in that instance.

## 2019-01-02 NOTE — Telephone Encounter (Signed)
I spoke with pt; pt said she had office visit 11/17/18 & 12/08/18. Pt used inhaler and took abx and got better and did not go to UC on 12/11/18.pt had covid test on 11/21/18 which was neg at [facility]. Pt has not had recent CXR and no official dx. 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 Pt said she has no symptoms now; pt said she has no cough but sometimes feels congestion or something in her chest and when she tries to cough she gets up clear phlegm. Pt said again she is not sick now but fears if she goes back into school building on 01/08/19 she will get sick again like she did in August 2020. Pt request cb after Dr Glori Bickers reviews on 01/03/19.

## 2019-01-03 NOTE — Telephone Encounter (Signed)
Left VM requesting pt to call the office  

## 2019-01-03 NOTE — Telephone Encounter (Signed)
Pt notified of Dr. Marliss Coots comments. Pt said she didn't want to not work she just wanted to keep working remotely. She said she will figure something out and thanks me for the call and hung up

## 2019-02-28 ENCOUNTER — Other Ambulatory Visit: Payer: Self-pay

## 2019-02-28 DIAGNOSIS — Z20822 Contact with and (suspected) exposure to covid-19: Secondary | ICD-10-CM

## 2019-03-02 LAB — NOVEL CORONAVIRUS, NAA: SARS-CoV-2, NAA: NOT DETECTED

## 2019-03-11 ENCOUNTER — Other Ambulatory Visit: Payer: Self-pay | Admitting: Family Medicine

## 2019-05-07 ENCOUNTER — Ambulatory Visit: Payer: BC Managed Care – PPO | Attending: Internal Medicine

## 2019-05-07 ENCOUNTER — Other Ambulatory Visit: Payer: BC Managed Care – PPO

## 2019-05-07 DIAGNOSIS — Z20822 Contact with and (suspected) exposure to covid-19: Secondary | ICD-10-CM

## 2019-05-08 ENCOUNTER — Other Ambulatory Visit: Payer: BC Managed Care – PPO

## 2019-05-08 LAB — NOVEL CORONAVIRUS, NAA: SARS-CoV-2, NAA: NOT DETECTED

## 2019-06-02 ENCOUNTER — Other Ambulatory Visit: Payer: Self-pay | Admitting: Family Medicine

## 2019-06-04 NOTE — Telephone Encounter (Signed)
Pt has had multiple acute appts but no f/u or CPE appts and no future appts., please advise

## 2019-06-04 NOTE — Telephone Encounter (Signed)
Please schedule a PE in spring or early summer and refill those Thanks

## 2019-06-05 NOTE — Telephone Encounter (Signed)
Med refilled once and Carrie will reach out to pt to try and get appt scheduled  

## 2019-06-05 NOTE — Telephone Encounter (Signed)
I left a message on patient's voice mail to return my call.  If patient returns my call, please schedule physical in spring or early summer.

## 2019-08-28 ENCOUNTER — Other Ambulatory Visit: Payer: Self-pay | Admitting: Family Medicine

## 2019-08-28 NOTE — Telephone Encounter (Signed)
Beth Reynolds tried multiple times to get appt scheduled in March. Pt never called and scheduled appt., meds declined until pt calls and schedules a f/u or CPE

## 2019-10-12 ENCOUNTER — Other Ambulatory Visit: Payer: Self-pay

## 2019-10-12 ENCOUNTER — Encounter: Payer: Self-pay | Admitting: Family Medicine

## 2019-10-12 ENCOUNTER — Ambulatory Visit: Payer: BC Managed Care – PPO | Admitting: Family Medicine

## 2019-10-12 VITALS — BP 138/88 | HR 84 | Temp 97.2°F | Ht 66.25 in | Wt 179.0 lb

## 2019-10-12 DIAGNOSIS — F17200 Nicotine dependence, unspecified, uncomplicated: Secondary | ICD-10-CM

## 2019-10-12 DIAGNOSIS — K581 Irritable bowel syndrome with constipation: Secondary | ICD-10-CM

## 2019-10-12 NOTE — Progress Notes (Signed)
Subjective:    Patient ID: Beth Reynolds, female    DOB: 10-09-1970, 49 y.o.   MRN: [id number]  This visit occurred during the SARS-CoV-2 public health emergency.  Safety protocols were in place, including screening questions prior to the visit, additional usage of staff PPE, and extensive cleaning of exam room while observing appropriate contact time as indicated for disinfecting solutions.    HPI Pt presents with c/o IBS/constipation   Wt Readings from Last 3 Encounters:  10/12/19 179 lb (81.2 kg)  11/17/18 178 lb (80.7 kg)  02/06/18 173 lb 8 oz (78.7 kg)   28.67 kg/m   Bloated- and her abdomen feels tight  She is only able to have bm with laxative (? Correct al she thinks)  She takes it on avg every 2-3 days   She has metamucil - drinks it in water  It hurts her stomach- makes her cramp    H/o constipation predominant IBS Has not had a colonoscopy yet-is scared of them (severely afraid of medical procedures and hospitals)  Fluids- does not drink a lot of water- does drink more than before  Drinks coffee/tea and juice  Fiber - fruit- banana/ honeydew melon/stawberries) - may be allergic to a lot of other fruits Can eat all vegetables  Exercise - better than it used to be -- runs after balls on tennis court and does rowing machine at the gym   Family hx - P cousin has stomach issues   Smoking- is gradually cutting back   Patient Active Problem List   Diagnosis Date Noted  . Dyspepsia 02/06/2018  . Low back pain 02/06/2018  . Elevated blood pressure reading 02/06/2018  . Rash of hands 03/18/2014  . IBS (irritable bowel syndrome) 01/17/2012  . Prediabetes 10/09/2010  . Routine general medical examination at a health care facility 10/04/2010  . MENOPAUSE, EARLY 11/03/2009  . Menopausal and postmenopausal disorder 10/11/2009  . Migraine 01/19/2008  . TOBACCO ABUSE 07/17/2007  . Adjustment disorder with mixed anxiety and depressed mood 07/17/2007  . Allergic  rhinitis 07/17/2007  . GERD 07/17/2007   Past Medical History:  Diagnosis Date  . Allergic rhinoconjunctivitis   . Anxiety   . Early menopause   . Migraine   . PMDD (premenstrual dysphoric disorder)   . TB (tuberculosis), treated   . Tobacco abuse    Past Surgical History:  Procedure Laterality Date  . DILATION AND CURETTAGE OF UTERUS    . LESION REMOVAL     from finger   Social History   Tobacco Use  . Smoking status: Current Every Day Smoker    Packs/day: 0.50  . Smokeless tobacco: Never Used  Substance Use Topics  . Alcohol use: No    Alcohol/week: 0.0 standard drinks  . Drug use: No   Family History  Problem Relation Age of Onset  . Diabetes Father    Allergies  Allergen Reactions  . Avocado     Swollen lips  . Paroxetine     REACTION: sever headache, not able to sleep and weight gain  . Pineapple     Swelling of lips  . Venlafaxine     REACTION: HA   Current Outpatient Medications on File Prior to Visit  Medication Sig Dispense Refill  . cetirizine (ZYRTEC) 10 MG tablet Take 10 mg by mouth daily as needed for allergies.    Marland Kitchen FLUoxetine (PROZAC) 20 MG tablet TAKE 1 TABLET BY MOUTH EVERY DAY 90 tablet 0  . lansoprazole (PREVACID) 30  MG capsule TAKE 1 CAPSULE BY MOUTH EVERY DAY 90 capsule 0  . albuterol (VENTOLIN HFA) 108 (90 Base) MCG/ACT inhaler INHALE 2 INHALATIONS INTO THE LUNGS EVERY 6 (SIX) HOURS AS NEEDED (Patient not taking: Reported on 10/12/2019) 6.7 g 5   No current facility-administered medications on file prior to visit.    Review of Systems  Constitutional: Negative for activity change, appetite change, fatigue, fever and unexpected weight change.  HENT: Negative for congestion, ear pain, rhinorrhea, sinus pressure and sore throat.   Eyes: Negative for pain, redness and visual disturbance.  Respiratory: Negative for cough, shortness of breath and wheezing.   Cardiovascular: Negative for chest pain and palpitations.  Gastrointestinal: Positive  for abdominal distention, abdominal pain and constipation. Negative for anal bleeding, blood in stool, diarrhea, nausea, rectal pain and vomiting.  Endocrine: Negative for polydipsia and polyuria.  Genitourinary: Negative for dysuria, frequency and urgency.  Musculoskeletal: Negative for arthralgias, back pain and myalgias.  Skin: Negative for pallor and rash.  Allergic/Immunologic: Negative for environmental allergies.  Neurological: Negative for dizziness, syncope and headaches.  Hematological: Negative for adenopathy. Does not bruise/bleed easily.  Psychiatric/Behavioral: Negative for decreased concentration and dysphoric mood. The patient is not nervous/anxious.        Objective:   Physical Exam Constitutional:      General: She is not in acute distress.    Appearance: Normal appearance. She is well-developed and normal weight. She is not ill-appearing or diaphoretic.  HENT:     Head: Normocephalic and atraumatic.  Eyes:     General: No scleral icterus.    Conjunctiva/sclera: Conjunctivae normal.     Pupils: Pupils are equal, round, and reactive to light.  Cardiovascular:     Rate and Rhythm: Normal rate and regular rhythm.     Heart sounds: Normal heart sounds.  Pulmonary:     Effort: Pulmonary effort is normal. No respiratory distress.     Breath sounds: Normal breath sounds. No wheezing or rales.  Abdominal:     General: Abdomen is flat. Bowel sounds are normal. There is no distension.     Palpations: Abdomen is soft. There is no hepatomegaly, splenomegaly, mass or pulsatile mass.     Tenderness: There is no abdominal tenderness. There is no guarding or rebound. Negative signs include Murphy's sign.     Comments: Abdomen is soft- per pt not feeling bloated or tight today  Musculoskeletal:     Cervical back: Normal range of motion and neck supple.  Lymphadenopathy:     Cervical: No cervical adenopathy.  Skin:    General: Skin is warm and dry.     Coloration: Skin is not  pale.     Findings: No erythema.  Neurological:     Mental Status: She is alert.     Coordination: Coordination normal.     Deep Tendon Reflexes: Reflexes normal.  Psychiatric:        Mood and Affect: Mood normal.           Assessment & Plan:   Problem List Items Addressed This Visit      Digestive   IBS (irritable bowel syndrome) - Primary    Constipation predominant with bloating - dependent on women's laxative  She declines colonoscopy for dx or for screening unfortunately  Needs better fluid/water intake -will recommend 46 oz daily  Also more fiber from produce  Adv to stop her fiber supplement and try miralax -starting with tid dosing until bowels start to become regular and  then titrate to need for regular bms  Watch for inc pain or blood in stool           Other   TOBACCO ABUSE    Per pt-cutting back but not ready to quit yet

## 2019-10-12 NOTE — Patient Instructions (Addendum)
Aim for 64 or more oz of fluid daily - primarily water   Eat your veggies   Get miralax (store brand) and take as directed three times daily  Get a feeling for how much you need daily based on response   Goal is to use the women's laxative less   If/when you are open to a GI consult -let us know   Keep [place] posted   Gas ex is fine as needed

## 2019-10-14 ENCOUNTER — Encounter: Payer: Self-pay | Admitting: Family Medicine

## 2019-10-14 NOTE — Assessment & Plan Note (Signed)
Constipation predominant with bloating - dependent on women's laxative  She declines colonoscopy for dx or for screening unfortunately  Needs better fluid/water intake -will recommend 46 oz daily  Also more fiber from produce  Adv to stop her fiber supplement and try miralax -starting with tid dosing until bowels start to become regular and then titrate to need for regular bms  Watch for inc pain or blood in stool

## 2019-10-14 NOTE — Assessment & Plan Note (Signed)
Per pt-cutting back but not ready to quit yet

## 2019-10-29 ENCOUNTER — Other Ambulatory Visit: Payer: Self-pay | Admitting: Family Medicine

## 2019-10-31 NOTE — Telephone Encounter (Signed)
Pt left v/m that [facility] advised pt we had not responded to lansoprazole refill. I spoke with Clifton Custard at [facility] [employer] and they did receive the approval for lansoprazole but refill was placed on hold and Clifton Custard not sure why; Clifton Custard said out of stock at present but will order and should be in on 11/01/19 in afternoon. Pt notified and voiced understanding; nothing further needed at this time.

## 2020-01-06 ENCOUNTER — Other Ambulatory Visit: Payer: Self-pay | Admitting: Family Medicine

## 2020-01-07 NOTE — Telephone Encounter (Signed)
Pt had an acute appt on 10/12/19, no recent or future f/u or CPE appts., please advise

## 2020-01-07 NOTE — Telephone Encounter (Signed)
Please schedule PE and refill until then  

## 2020-01-08 NOTE — Telephone Encounter (Signed)
Pt agree to make CPE appt. The call routed to the front office for appt.

## 2020-01-21 ENCOUNTER — Telehealth: Payer: Self-pay | Admitting: Family Medicine

## 2020-01-21 DIAGNOSIS — R7303 Prediabetes: Secondary | ICD-10-CM

## 2020-01-21 DIAGNOSIS — Z0000 Encounter for general adult medical examination without abnormal findings: Secondary | ICD-10-CM

## 2020-01-21 NOTE — Telephone Encounter (Signed)
-----   Message from Alvina Chou sent at 01/15/2020  2:08 PM EDT ----- Regarding: Lab orders for Tuesday, 10.26.21 Patient is scheduled for CPX labs, please order future labs, Thanks , Camelia Eng

## 2020-01-22 ENCOUNTER — Other Ambulatory Visit (INDEPENDENT_AMBULATORY_CARE_PROVIDER_SITE_OTHER): Payer: BC Managed Care – PPO

## 2020-01-22 ENCOUNTER — Other Ambulatory Visit: Payer: Self-pay

## 2020-01-22 DIAGNOSIS — R7303 Prediabetes: Secondary | ICD-10-CM | POA: Diagnosis not present

## 2020-01-22 DIAGNOSIS — Z0000 Encounter for general adult medical examination without abnormal findings: Secondary | ICD-10-CM | POA: Diagnosis not present

## 2020-01-22 LAB — HEMOGLOBIN A1C: Hgb A1c MFr Bld: 6.3 % (ref 4.6–6.5)

## 2020-01-22 LAB — CBC WITH DIFFERENTIAL/PLATELET
Basophils Absolute: 0.1 10*3/uL (ref 0.0–0.1)
Basophils Relative: 0.7 % (ref 0.0–3.0)
Eosinophils Absolute: 0.2 10*3/uL (ref 0.0–0.7)
Eosinophils Relative: 2.1 % (ref 0.0–5.0)
HCT: 38.3 % (ref 36.0–46.0)
Hemoglobin: 12.9 g/dL (ref 12.0–15.0)
Lymphocytes Relative: 35 % (ref 12.0–46.0)
Lymphs Abs: 3.5 10*3/uL (ref 0.7–4.0)
MCHC: 33.7 g/dL (ref 30.0–36.0)
MCV: 87 fl (ref 78.0–100.0)
Monocytes Absolute: 0.7 10*3/uL (ref 0.1–1.0)
Monocytes Relative: 6.5 % (ref 3.0–12.0)
Neutro Abs: 5.6 10*3/uL (ref 1.4–7.7)
Neutrophils Relative %: 55.7 % (ref 43.0–77.0)
Platelets: 351 10*3/uL (ref 150.0–400.0)
RBC: 4.4 Mil/uL (ref 3.87–5.11)
RDW: 13 % (ref 11.5–15.5)
WBC: 10.1 10*3/uL (ref 4.0–10.5)

## 2020-01-22 LAB — COMPREHENSIVE METABOLIC PANEL
ALT: 16 U/L (ref 0–35)
AST: 15 U/L (ref 0–37)
Albumin: 4.1 g/dL (ref 3.5–5.2)
Alkaline Phosphatase: 125 U/L — ABNORMAL HIGH (ref 39–117)
BUN: 9 mg/dL (ref 6–23)
CO2: 27 mEq/L (ref 19–32)
Calcium: 9.5 mg/dL (ref 8.4–10.5)
Chloride: 102 mEq/L (ref 96–112)
Creatinine, Ser: 0.77 mg/dL (ref 0.40–1.20)
GFR: 89.15 mL/min (ref >60.00)
Glucose, Bld: 90 mg/dL (ref 70–99)
Potassium: 4.2 mEq/L (ref 3.5–5.1)
Sodium: 137 mEq/L (ref 135–145)
Total Bilirubin: 0.5 mg/dL (ref 0.2–1.2)
Total Protein: 6.6 g/dL (ref 6.0–8.3)

## 2020-01-22 LAB — LIPID PANEL
Cholesterol: 158 mg/dL (ref 0–200)
HDL: 33.8 mg/dL — ABNORMAL LOW (ref >39.00)
NonHDL: 123.83
Total CHOL/HDL Ratio: 5
Triglycerides: 232 mg/dL — ABNORMAL HIGH (ref 0.0–149.0)
VLDL: 46.4 mg/dL — ABNORMAL HIGH (ref 0.0–40.0)

## 2020-01-22 LAB — LDL CHOLESTEROL, DIRECT: Direct LDL: 105 mg/dL

## 2020-01-22 LAB — TSH: TSH: 1.49 u[IU]/mL (ref 0.35–4.50)

## 2020-01-29 ENCOUNTER — Other Ambulatory Visit: Payer: Self-pay

## 2020-01-29 ENCOUNTER — Ambulatory Visit (INDEPENDENT_AMBULATORY_CARE_PROVIDER_SITE_OTHER): Payer: BC Managed Care – PPO | Admitting: Family Medicine

## 2020-01-29 ENCOUNTER — Other Ambulatory Visit (HOSPITAL_COMMUNITY)
Admission: RE | Admit: 2020-01-29 | Discharge: 2020-01-29 | Disposition: A | Discharge status: Home / Self Care | Payer: BC Managed Care – PPO | Source: Ambulatory Visit | Attending: Family Medicine | Admitting: Family Medicine

## 2020-01-29 ENCOUNTER — Encounter: Payer: Self-pay | Admitting: Family Medicine

## 2020-01-29 VITALS — BP 118/84 | HR 84 | Temp 96.4°F | Ht 66.5 in | Wt 178.6 lb

## 2020-01-29 DIAGNOSIS — Z01419 Encounter for gynecological examination (general) (routine) without abnormal findings: Secondary | ICD-10-CM | POA: Insufficient documentation

## 2020-01-29 DIAGNOSIS — Z0000 Encounter for general adult medical examination without abnormal findings: Secondary | ICD-10-CM | POA: Diagnosis not present

## 2020-01-29 DIAGNOSIS — Z23 Encounter for immunization: Secondary | ICD-10-CM

## 2020-01-29 DIAGNOSIS — F17200 Nicotine dependence, unspecified, uncomplicated: Secondary | ICD-10-CM

## 2020-01-29 DIAGNOSIS — Z1231 Encounter for screening mammogram for malignant neoplasm of breast: Secondary | ICD-10-CM | POA: Insufficient documentation

## 2020-01-29 DIAGNOSIS — R7303 Prediabetes: Secondary | ICD-10-CM | POA: Diagnosis not present

## 2020-01-29 DIAGNOSIS — F4323 Adjustment disorder with mixed anxiety and depressed mood: Secondary | ICD-10-CM

## 2020-01-29 DIAGNOSIS — Z1211 Encounter for screening for malignant neoplasm of colon: Secondary | ICD-10-CM

## 2020-01-29 DIAGNOSIS — K581 Irritable bowel syndrome with constipation: Secondary | ICD-10-CM

## 2020-01-29 DIAGNOSIS — M542 Cervicalgia: Secondary | ICD-10-CM | POA: Insufficient documentation

## 2020-01-29 NOTE — Assessment & Plan Note (Signed)
Routine exam and pap done  No c/o  Menopausal since age 49

## 2020-01-29 NOTE — Assessment & Plan Note (Signed)
Pt not ready to do a colonoscopy but open to ifob kit  Given today

## 2020-01-29 NOTE — Patient Instructions (Addendum)
You can call to schedule your mammogram at [facility]   For diabetes prevention  Try to get most of your carbohydrates from produce (with the exception of white potatoes)  Eat less bread/pasta/rice/snack foods/cereals/sweets and other items from the middle of the grocery store (processed carbs)  Stop sweets and added sugar the best you can  Minimize rice and bread if you can  Also keep walking   Flu shot today  Keep thinking about quitting smoking   Keep taking vitamin D for bone health

## 2020-01-29 NOTE — Assessment & Plan Note (Signed)
Improved taking miralax on a schedule

## 2020-01-29 NOTE — Progress Notes (Signed)
Subjective:    Patient ID: Beth Reynolds, female    DOB: 1970-12-26, 49 y.o.   MRN: [id number]  This visit occurred during the SARS-CoV-2 public health emergency.  Safety protocols were in place, including screening questions prior to the visit, additional usage of staff PPE, and extensive cleaning of exam room while observing appropriate contact time as indicated for disinfecting solutions.    HPI Here for health maintenance exam and to review chronic medical problems    Wt Readings from Last 3 Encounters:  01/29/20 178 lb 9.6 oz (81 kg)  10/12/19 179 lb (81.2 kg)  11/17/18 178 lb (80.7 kg)   28.39 kg/m   Walks every day and eats healthy  Less bloating from IBS -miralax helps that   also notes L shoulder stiffness/ slept funny on it a month ago (L neck pain and trapezius area) -radiates down arm to fingers - some tingling    Flu vaccine -declines  covid status -she had booster coming up , [facility]fizer  Zoster status - not interested in vaccine  Tdap 7/12   Colon cancer screening -declines colonoscopy in the past (anxiety about it and procedure)  Open to ifob kit    Mammogram - has not done in the past , is open to one now [employer]   Self breast exam -no lumps   Would like to do pap and gyn exam today  Has not had in a while   Smoking status - less than she was  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 so this has cut  5-6 per day now  Thinking about quitting  No breathing problems   fam hx of DM in father  Mother HTN  Cousins -stomach cancer  BP Readings from Last 3 Encounters:  01/29/20 118/84  10/12/19 138/88  12/08/18 (!) 146/85   Pulse Readings from Last 3 Encounters:  01/29/20 84  10/12/19 84  12/08/18 85   Takes vit D No ca due to constipation  Also childrens mvi  Prediabetes Lab Results  Component Value Date   HGBA1C 6.3 01/22/2020  up from 6.2 Diet is pretty good and she is walking  Working on her sugar intake  (loves sweet coffee, gum and cookies)  She made some changes when she got labs back    Lipids  Lab Results  Component Value Date   CHOL 158 01/22/2020   CHOL 151 10/03/2017   CHOL 151 10/05/2010   Lab Results  Component Value Date   HDL 33.80 (L) 01/22/2020   HDL 43.10 10/03/2017   HDL 42.50 10/05/2010   Lab Results  Component Value Date   LDLCALC 73 10/03/2017   LDLCALC 71 10/05/2010   Lab Results  Component Value Date   TRIG 232.0 (H) 01/22/2020   TRIG 178.0 (H) 10/03/2017   TRIG 188.0 (H) 10/05/2010   Lab Results  Component Value Date   CHOLHDL 5 01/22/2020   CHOLHDL 4 10/03/2017   CHOLHDL 4 10/05/2010   Lab Results  Component Value Date   LDLDIRECT 105.0 01/22/2020  she has cut back on butter  HDL is down  Loves fish when she can get     H/o anxiety/depression  Stopped her fluoxetine when she was feeling better and fine w/o it now  Dealing with stress differently   Other labs Lab Results  Component Value Date   CREATININE 0.77 01/22/2020   BUN 9 01/22/2020   NA 137 01/22/2020   K 4.2 01/22/2020  CL 102 01/22/2020   CO2 27 01/22/2020   Lab Results  Component Value Date   ALT 16 01/22/2020   AST 15 01/22/2020   ALKPHOS 125 (H) 01/22/2020   BILITOT 0.5 01/22/2020   Lab Results  Component Value Date   WBC 10.1 01/22/2020   HGB 12.9 01/22/2020   HCT 38.3 01/22/2020   MCV 87.0 01/22/2020   PLT 351.0 01/22/2020   Lab Results  Component Value Date   TSH 1.49 01/22/2020     Patient Active Problem List   Diagnosis Date Noted  . Screening mammogram, encounter for 01/29/2020  . Encounter for routine gynecological examination 01/29/2020  . Colon cancer screening 01/29/2020  . Neck pain 01/29/2020  . Low back pain 02/06/2018  . Elevated blood pressure reading 02/06/2018  . Rash of hands 03/18/2014  . IBS (irritable bowel syndrome) 01/17/2012  . Prediabetes 10/09/2010  . Routine general medical examination at a health care facility  10/04/2010  . MENOPAUSE, EARLY 11/03/2009  . Menopausal and postmenopausal disorder 10/11/2009  . Migraine 01/19/2008  . TOBACCO ABUSE 07/17/2007  . Adjustment disorder with mixed anxiety and depressed mood 07/17/2007  . Allergic rhinitis 07/17/2007  . GERD 07/17/2007   Past Medical History:  Diagnosis Date  . Allergic rhinoconjunctivitis   . Anxiety   . Early menopause   . Migraine   . PMDD (premenstrual dysphoric disorder)   . TB (tuberculosis), treated   . Tobacco abuse    Past Surgical History:  Procedure Laterality Date  . DILATION AND CURETTAGE OF UTERUS    . LESION REMOVAL     from finger   Social History   Tobacco Use  . Smoking status: Current Every Day Smoker    Packs/day: 0.50  . Smokeless tobacco: Never Used  Substance Use Topics  . Alcohol use: No    Alcohol/week: 0.0 standard drinks  . Drug use: No   Family History  Problem Relation Age of Onset  . Diabetes Father    Allergies  Allergen Reactions  . Avocado     Swollen lips  . Paroxetine     REACTION: sever headache, not able to sleep and weight gain  . Pineapple     Swelling of lips  . Venlafaxine     REACTION: HA   Current Outpatient Medications on File Prior to Visit  Medication Sig Dispense Refill  . cetirizine (ZYRTEC) 10 MG tablet Take 10 mg by mouth daily as needed for allergies.    Marland Kitchen lansoprazole (PREVACID) 30 MG capsule TAKE 1 CAPSULE BY MOUTH EVERY DAY 90 capsule 1  . albuterol (VENTOLIN HFA) 108 (90 Base) MCG/ACT inhaler INHALE 2 INHALATIONS INTO THE LUNGS EVERY 6 (SIX) HOURS AS NEEDED (Patient not taking: Reported on 10/12/2019) 6.7 g 5   No current facility-administered medications on file prior to visit.    Review of Systems  Constitutional: Positive for fatigue. Negative for activity change, appetite change, fever and unexpected weight change.  HENT: Negative for congestion, ear pain, rhinorrhea, sinus pressure and sore throat.   Eyes: Negative for pain, redness and visual  disturbance.  Respiratory: Negative for cough, shortness of breath and wheezing.   Cardiovascular: Negative for chest pain and palpitations.  Gastrointestinal: Negative for abdominal pain, blood in stool, constipation and diarrhea.  Endocrine: Negative for polydipsia and polyuria.  Genitourinary: Negative for dysuria, frequency and urgency.  Musculoskeletal: Positive for neck pain. Negative for arthralgias, back pain and myalgias.  Skin: Negative for pallor and rash.  Itching hands occ  Allergic/Immunologic: Negative for environmental allergies.  Neurological: Negative for dizziness, syncope and headaches.       Tingling in L hand   Hematological: Negative for adenopathy. Does not bruise/bleed easily.  Psychiatric/Behavioral: Negative for decreased concentration and dysphoric mood. The patient is not nervous/anxious.        Objective:   Physical Exam Constitutional:      General: She is not in acute distress.    Appearance: Normal appearance. She is well-developed and normal weight. She is not ill-appearing or diaphoretic.  HENT:     Head: Normocephalic and atraumatic.     Right Ear: Tympanic membrane, ear canal and external ear normal.     Left Ear: Tympanic membrane, ear canal and external ear normal.     Nose: Nose normal. No congestion.     Mouth/Throat:     Mouth: Mucous membranes are moist.     Pharynx: Oropharynx is clear. No posterior oropharyngeal erythema.  Eyes:     General: No scleral icterus.    Extraocular Movements: Extraocular movements intact.     Conjunctiva/sclera: Conjunctivae normal.     Pupils: Pupils are equal, round, and reactive to light.  Neck:     Thyroid: No thyromegaly.     Vascular: No carotid bruit or JVD.  Cardiovascular:     Rate and Rhythm: Normal rate and regular rhythm.     Pulses: Normal pulses.     Heart sounds: Normal heart sounds. No gallop.   Pulmonary:     Effort: Pulmonary effort is normal. No respiratory distress.     Breath  sounds: Normal breath sounds. No stridor. No wheezing or rhonchi.     Comments: Good air exch Chest:     Chest wall: No tenderness.  Abdominal:     General: Bowel sounds are normal. There is no distension or abdominal bruit.     Palpations: Abdomen is soft. There is no mass.     Tenderness: There is no abdominal tenderness.     Hernia: No hernia is present.  Genitourinary:    Comments: Breast exam: No mass, nodules, thickening, tenderness, bulging, retraction, inflamation, nipple discharge or skin changes noted.  No axillary or clavicular LA.              Anus appears normal w/o hemorrhoids or masses     External genitalia : nl appearance and hair distribution/no lesions     Urethral meatus : nl size, no lesions or prolapse     Urethra: no masses, tenderness or scarring    Bladder : no masses or tenderness     Vagina: nl general appearance, no discharge or  Lesions, no significant cystocele  or rectocele     Cervix: no lesions/ discharge or friability    Uterus: nl size, contour, position, and mobility (not fixed) , non tender    Adnexa : no masses, tenderness, enlargement or nodularity       Musculoskeletal:        General: No tenderness. Normal range of motion.     Cervical back: Normal range of motion and neck supple. No rigidity. No muscular tenderness.     Right lower leg: No edema.     Left lower leg: No edema.  Lymphadenopathy:     Cervical: No cervical adenopathy.  Skin:    General: Skin is warm and dry.     Coloration: Skin is not pale.     Findings: No erythema or rash.  Comments: Few skin tags and lentigines  Neurological:     Mental Status: She is alert. Mental status is at baseline.     Cranial Nerves: No cranial nerve deficit.     Motor: No abnormal muscle tone.     Coordination: Coordination normal.     Gait: Gait normal.     Deep Tendon Reflexes: Reflexes are normal and symmetric. Reflexes normal.  Psychiatric:        Mood and  Affect: Mood normal.        Cognition and Memory: Cognition and memory normal.     Comments: Good mood           Assessment & Plan:   Problem List Items Addressed This Visit      Digestive   IBS (irritable bowel syndrome)    Improved taking miralax on a schedule        Other   TOBACCO ABUSE    Disc in detail risks of smoking and possible outcomes including copd, vascular/ heart disease, cancer , respiratory and sinus infections  Pt voices understanding She has cut back to 6-8 cig per day and is currently thinking about quitting smoking  Offered help if she needs it      Adjustment disorder with mixed anxiety and depressed mood    Doing better and was able to come off fluoxetine  Reviewed stressors/ coping techniques/symptoms/ support sources/ tx options and side effects in detail today       Routine general medical examination at a health care facility - Primary    Reviewed health habits including diet and exercise and skin cancer prevention Reviewed appropriate screening tests for age  Also reviewed health mt list, fam hx and immunization status , as well as social and family history   See HPI Labs reviewed  Mammogram ordered  Advised to stay on vit D for bone health  Enc her to keep walking daily  Flu shot given Gyn exam with pap done  She is covid immunized       Prediabetes    Lab Results  Component Value Date   HGBA1C 6.3 01/22/2020   disc imp of low glycemic diet and wt loss to prevent DM2  Handouts given       Screening mammogram, encounter for    Referred for screening mammogram at [facility]  Pt has # to schedule  Nl breast exam Enc regular self breast exams No fam hx      Relevant Orders   MM 3D SCREEN BREAST BILATERAL   Encounter for routine gynecological examination    Routine exam and pap done  No c/o  Menopausal since age 23        Relevant Orders   Cytology - PAP([facility])   Colon cancer screening    Pt not ready to do a  colonoscopy but open to ifob kit  Given today       Other Visit Diagnoses    Need for influenza vaccination       Relevant Orders   Flu Vaccine QUAD 6+ mos PF IM (Fluarix Quad PF) (Completed)

## 2020-01-29 NOTE — Assessment & Plan Note (Signed)
L sided  About a month  Some radicular symptoms-arm to hand  She may consider return for CS xray if not improved

## 2020-01-29 NOTE — Assessment & Plan Note (Signed)
Reviewed health habits including diet and exercise and skin cancer prevention Reviewed appropriate screening tests for age  Also reviewed health mt list, fam hx and immunization status , as well as social and family history   See HPI Labs reviewed  Mammogram ordered  Advised to stay on vit D for bone health  Enc her to keep walking daily  Flu shot given Gyn exam with pap done  She is covid immunized

## 2020-01-29 NOTE — Assessment & Plan Note (Signed)
Doing better and was able to come off fluoxetine  Reviewed stressors/ coping techniques/symptoms/ support sources/ tx options and side effects in detail today

## 2020-01-29 NOTE — Assessment & Plan Note (Signed)
Lab Results  Component Value Date   HGBA1C 6.3 01/22/2020   disc imp of low glycemic diet and wt loss to prevent DM2  Handouts given

## 2020-01-29 NOTE — Assessment & Plan Note (Signed)
Referred for screening mammogram at [facility]  Pt has # to schedule  Nl breast exam Enc regular self breast exams No fam hx

## 2020-01-29 NOTE — Assessment & Plan Note (Signed)
Disc in detail risks of smoking and possible outcomes including copd, vascular/ heart disease, cancer , respiratory and sinus infections  Pt voices understanding She has cut back to 6-8 cig per day and is currently thinking about quitting smoking  Offered help if she needs it

## 2020-01-31 LAB — CYTOLOGY - PAP
Adequacy: ABSENT
Comment: NEGATIVE
Diagnosis: NEGATIVE
High risk HPV: NEGATIVE

## 2020-02-08 ENCOUNTER — Ambulatory Visit: Payer: BC Managed Care – PPO | Admitting: Family Medicine

## 2020-03-27 ENCOUNTER — Other Ambulatory Visit: Payer: Self-pay | Admitting: Family Medicine

## 2020-04-18 ENCOUNTER — Other Ambulatory Visit: Payer: Self-pay | Admitting: Family Medicine

## 2020-07-31 ENCOUNTER — Telehealth: Payer: Self-pay

## 2020-07-31 NOTE — Telephone Encounter (Signed)
Spoke with patient who stated that on Monday she went to a walk in clinic and her B/P was 185/65. The doctor at the walk in clinic instructed patient to monitor her B/P and call PCP if it remained elevated. Since then, B/P has remained elevated, but patient is currently asymptomatic. Patient reported, "I only had a slight headache on Monday and Tuesday. I feel fine now and don't think I need to be seen sooner." Patient has an appointment with Dr. Milinda Antis on 5/10. UC and ED precautions given. Patient verbalized understanding.

## 2020-07-31 NOTE — Telephone Encounter (Signed)
[facility] - Client TELEPHONE ADVICE RECORD [facility] Patient Name: Beth Reynolds Gender: Female DOB: [birth year] Age: 50 Y 2 M 17 D Return Phone Number: [phone] (Primary) Address: City/ State/ Zip: [place] [address] Client [facility] - Client Client Site [facility] - Day Physician [place], Idamae Schuller - MD Contact Type Call Who Is Calling Patient / Member / Family / Caregiver Call Type Triage / Clinical Relationship To Patient Self Return Phone Number [phone] (Primary) Chief Complaint Headache Reason for Call Symptomatic / Request for Health Information Initial Comment Caller states that her blood pressure is currently 190/70, 189/75 she also has headaches as well Translation No Nurse Assessment Nurse: Kizzie Bane, RN, Marylene Land Date/Time (Eastern Time): 07/31/2020 10:29:17 AM Confirm and document reason for call. If symptomatic, describe symptoms. ---Caller states her blood pressure is 185/65 currently. No symptoms Does the patient have any new or worsening symptoms? ---Yes Will a triage be completed? ---Yes Related visit to physician within the last 2 weeks? ---Yes Does the PT have any chronic conditions? (i.e. diabetes, asthma, this includes High risk factors for pregnancy, etc.) ---No Is the patient pregnant or possibly pregnant? (Ask all females between the ages of 18-55) ---No Is this a behavioral health or substance abuse call? ---No Guidelines Guideline Title Affirmed Question Affirmed Notes Nurse Date/Time (Eastern Time) Blood Pressure - High Systolic BP >= 160 OR Diastolic >= 100 Kizzie Bane, RN, Marylene Land 07/31/2020 10:30:46 AM Disp. Time Lamount Cohen Time) Disposition Final User 07/31/2020 10:32:48 AM SEE PCP WITHIN 3 DAYS Yes Kizzie Bane, RN, Rosalyn Charters Disagree/Comply Comply PLEASE NOTE: All timestamps contained within this report are represented as [place] Standard  Time. CONFIDENTIALTY NOTICE: This fax transmission is intended only for the addressee. It contains information that is legally privileged, confidential or otherwise protected from use or disclosure. If you are not the intended recipient, you are strictly prohibited from reviewing, disclosing, copying using or disseminating any of this information or taking any action in reliance on or regarding this information. If you have received this fax in error, please notify us immediately by telephone so that we can arrange for its return to [place]. Phone: [phone], Toll-Free: [phone], Fax: [phone] Page: 2 of 2 Call Id: [id number] Caller Understands Yes PreDisposition Did not know what to do Care Advice Given Per Guideline SEE PCP WITHIN 3 DAYS: * You need to be seen within 2 or 3 days. CARE ADVICE given per High Blood Pressure (Adult) guideline. CALL BACK IF: * Weakness or numbness of the face, arm or leg on one side of the body occurs * Difficulty walking, difficulty talking, or severe headache occurs * Chest pain or difficulty breathing occurs * Your blood pressure is over 180/110 * You become worse Referrals REFERRED TO PCP OFFICE

## 2020-07-31 NOTE — Telephone Encounter (Signed)
Aware, will see her then 

## 2020-08-05 ENCOUNTER — Ambulatory Visit (INDEPENDENT_AMBULATORY_CARE_PROVIDER_SITE_OTHER): Payer: BC Managed Care – PPO | Admitting: Family Medicine

## 2020-08-05 ENCOUNTER — Other Ambulatory Visit: Payer: Self-pay

## 2020-08-05 ENCOUNTER — Encounter: Payer: Self-pay | Admitting: Family Medicine

## 2020-08-05 VITALS — BP 150/90 | HR 90 | Temp 96.9°F | Ht 66.0 in | Wt 176.5 lb

## 2020-08-05 DIAGNOSIS — M545 Low back pain, unspecified: Secondary | ICD-10-CM

## 2020-08-05 DIAGNOSIS — F430 Acute stress reaction: Secondary | ICD-10-CM | POA: Diagnosis not present

## 2020-08-05 DIAGNOSIS — I10 Essential (primary) hypertension: Secondary | ICD-10-CM | POA: Diagnosis not present

## 2020-08-05 DIAGNOSIS — F17200 Nicotine dependence, unspecified, uncomplicated: Secondary | ICD-10-CM | POA: Diagnosis not present

## 2020-08-05 MED ORDER — AMLODIPINE BESYLATE 5 MG PO TABS: 2.5000 mg | ORAL_TABLET | Freq: Every day | ORAL | 3 refills | Status: DC

## 2020-08-05 NOTE — Assessment & Plan Note (Signed)
Caregiver-mother in [place] Aunt passed away Very high work stress Reviewed stressors/ coping techniques/symptoms/ support sources/ tx options and side effects in detail today Pt may consider counseling in the future -will let us know

## 2020-08-05 NOTE — Assessment & Plan Note (Signed)
bp remains high  Disc goals for control and reasons to treat  Family hx noted  Also high stress/ smoking may add to  Will start amlodipine 2.5 mg daily  Continue to work on lifestyle change incl smoking cessation and DASH eating  F/u 4-6 wk or earlier if needed  Rev last labs Given handout re: HTN

## 2020-08-05 NOTE — Patient Instructions (Addendum)
You will get a call regarding orthopedics appointment   Try the exercise/stretch protocol   Use heat carefully - 10 minutes at a time  Walking is good   Over the counter nsaids can raise blood pressure a bit  The topical version (voltaren gel) may be helpful   Take the amlodipine 2.5 mg daily  Let me know if any problems with medicine   We may want to consider a counselor/therapist for stress in the future   Follow up in about 4-6 weeks

## 2020-08-05 NOTE — Assessment & Plan Note (Signed)
R sided with sciatica Rev note from UC  With imaging (per pt showed deg change)  Prednisone and muscle relaxer helped initially  Given handout re: sciatica exercises/rehab and discussed strategy Ref to orthopedics Will update if symptoms worsen before she gets an appt

## 2020-08-05 NOTE — Progress Notes (Signed)
Subjective:    Patient ID: Beth Reynolds, female    DOB: 30-Oct-1970, 50 y.o.   MRN: [id number]  This visit occurred during the SARS-CoV-2 public health emergency.  Safety protocols were in place, including screening questions prior to the visit, additional usage of staff PPE, and extensive cleaning of exam room while observing appropriate contact time as indicated for disinfecting solutions.    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 Deg disc changes   Interestingly -better if she wears heels  Bending backwards is better than forwards   BP Readings from Last 3 Encounters:  08/05/20 (!) 154/96  01/29/20 118/84  10/12/19 138/88    Pulse Readings from Last 3 Encounters:  08/05/20 90  01/29/20 84  10/12/19 84    Mother has HTN -started in 60s    Lab Results  Component Value Date   CREATININE 0.77 01/22/2020   BUN 9 01/22/2020   NA 137 01/22/2020   K 4.2 01/22/2020   CL 102 01/22/2020   CO2 27 01/22/2020   Lab Results  Component Value Date   ALT 16 01/22/2020   AST 15 01/22/2020   ALKPHOS 125 (H) 01/22/2020   BILITOT 0.5 01/22/2020   Lab Results   Component Value Date   WBC 10.1 01/22/2020   HGB 12.9 01/22/2020   HCT 38.3 01/22/2020   MCV 87.0 01/22/2020   PLT 351.0 01/22/2020   Lab Results  Component Value Date   TSH 1.49 01/22/2020   Lab Results  Component Value Date   HGBA1C 6.3 01/22/2020    Using DASH eating plan   Patient Active Problem List   Diagnosis Date Noted  . Stress reaction 08/05/2020  . Screening mammogram, encounter for 01/29/2020  . Encounter for routine gynecological examination 01/29/2020  . Colon cancer screening 01/29/2020  . Neck pain 01/29/2020  . Low back pain 02/06/2018  . Essential hypertension 02/06/2018  . Rash of hands 03/18/2014  . IBS (irritable bowel syndrome) 01/17/2012  . Prediabetes 10/09/2010  . Routine general medical examination at a health care facility 10/04/2010  . MENOPAUSE, EARLY 11/03/2009  . Menopausal and postmenopausal disorder 10/11/2009  . Migraine 01/19/2008  . TOBACCO ABUSE 07/17/2007  . Adjustment disorder with mixed anxiety and depressed mood 07/17/2007  . Allergic rhinitis 07/17/2007  . GERD 07/17/2007   Past Medical History:  Diagnosis Date  . Allergic rhinoconjunctivitis   . Anxiety   . Early menopause   .  Migraine   . PMDD (premenstrual dysphoric disorder)   . TB (tuberculosis), treated   . Tobacco abuse    Past Surgical History:  Procedure Laterality Date  . DILATION AND CURETTAGE OF UTERUS    . LESION REMOVAL     from finger   Social History   Tobacco Use  . Smoking status: Current Every Day Smoker    Packs/day: 0.25  . Smokeless tobacco: Never Used  Substance Use Topics  . Alcohol use: No    Alcohol/week: 0.0 standard drinks  . Drug use: No   Family History  Problem Relation Age of Onset  . Diabetes Father    Allergies  Allergen Reactions  . Avocado     Swollen lips  . Paroxetine     REACTION: sever headache, not able to sleep and weight gain  . Pineapple     Swelling of lips  . Venlafaxine     REACTION: HA   Current  Outpatient Medications on File Prior to Visit  Medication Sig Dispense Refill  . cetirizine (ZYRTEC) 10 MG tablet Take 10 mg by mouth daily as needed for allergies.    Marland Kitchen lansoprazole (PREVACID) 30 MG capsule TAKE 1 CAPSULE BY MOUTH EVERY DAY 90 capsule 2   No current facility-administered medications on file prior to visit.    Review of Systems  Constitutional: Negative for activity change, appetite change, fatigue, fever and unexpected weight change.  HENT: Negative for congestion, ear pain, rhinorrhea, sinus pressure and sore throat.   Eyes: Negative for pain, redness and visual disturbance.  Respiratory: Negative for cough, shortness of breath and wheezing.   Cardiovascular: Negative for chest pain and palpitations.  Gastrointestinal: Negative for abdominal pain, blood in stool, constipation and diarrhea.  Endocrine: Negative for polydipsia and polyuria.  Genitourinary: Negative for dysuria, frequency and urgency.  Musculoskeletal: Positive for back pain. Negative for arthralgias and myalgias.  Skin: Negative for pallor and rash.  Allergic/Immunologic: Negative for environmental allergies.  Neurological: Negative for dizziness, syncope, light-headedness, numbness and headaches.  Hematological: Negative for adenopathy. Does not bruise/bleed easily.  Psychiatric/Behavioral: Positive for sleep disturbance. Negative for decreased concentration and dysphoric mood. The patient is nervous/anxious.        Objective:   Physical Exam Constitutional:      General: She is not in acute distress.    Appearance: Normal appearance. She is well-developed and normal weight. She is not ill-appearing or diaphoretic.  HENT:     Head: Normocephalic and atraumatic.  Eyes:     General: No scleral icterus.    Conjunctiva/sclera: Conjunctivae normal.     Pupils: Pupils are equal, round, and reactive to light.  Cardiovascular:     Rate and Rhythm: Normal rate and regular rhythm.  Pulmonary:      Effort: Pulmonary effort is normal.     Breath sounds: Normal breath sounds. No wheezing or rales.     Comments: Mildly distant bs Abdominal:     General: Bowel sounds are normal. There is no distension.     Palpations: Abdomen is soft.     Tenderness: There is no abdominal tenderness.  Musculoskeletal:        General: Tenderness present.     Cervical back: Normal range of motion and neck supple.     Lumbar back: Spasms and tenderness present. No swelling, edema, deformity or bony tenderness. Decreased range of motion.     Comments: Pain with flex of LS over 20 deg Imp with ext of LS No  bony tenderness R SLR causes buttock pain   Lymphadenopathy:     Cervical: No cervical adenopathy.  Skin:    General: Skin is warm and dry.     Coloration: Skin is not pale.     Findings: No erythema or rash.  Neurological:     Mental Status: She is alert.     Cranial Nerves: No cranial nerve deficit.     Sensory: No sensory deficit.     Motor: No atrophy or abnormal muscle tone.     Coordination: Coordination normal.     Deep Tendon Reflexes: Reflexes are normal and symmetric. Reflexes normal.     Comments: Negative SLR  Psychiatric:        Mood and Affect: Mood is anxious.        Cognition and Memory: Cognition and memory normal.     Comments: At times tearful  Candidly discusses stressors  Good insight            Assessment & Plan:   Problem List Items Addressed This Visit      Cardiovascular and Mediastinum   Essential hypertension - Primary    bp remains high  Disc goals for control and reasons to treat  Family hx noted  Also high stress/ smoking may add to  Will start amlodipine 2.5 mg daily  Continue to work on lifestyle change incl smoking cessation and DASH eating  F/u 4-6 wk or earlier if needed  Rev last labs Given handout re: HTN      Relevant Medications   amLODipine (NORVASC) 5 MG tablet     Other   TOBACCO ABUSE    Disc in detail risks of smoking and  possible outcomes including copd, vascular/ heart disease, cancer , respiratory and sinus infections  Pt voices understanding Pt is motivated to quit now with dx of HTN  Down to 5 cig per day  Enc her to keep up the good work      Low back pain    R sided with sciatica Rev note from [facility]  With imaging (per pt showed deg change)  Prednisone and muscle relaxer helped initially  Given handout re: sciatica exercises/rehab and discussed strategy Ref to orthopedics Will update if symptoms worsen before she gets an appt      Relevant Orders   Ambulatory referral to Orthopedic Surgery   Stress reaction    Caregiver-mother in [place] Aunt passed away Very high work stress Reviewed stressors/ coping techniques/symptoms/ support sources/ tx options and side effects in detail today Pt may consider counseling in the future -will let us know

## 2020-08-05 NOTE — Assessment & Plan Note (Signed)
Disc in detail risks of smoking and possible outcomes including copd, vascular/ heart disease, cancer , respiratory and sinus infections  Pt voices understanding Pt is motivated to quit now with dx of HTN  Down to 5 cig per day  Enc her to keep up the good work

## 2020-09-11 ENCOUNTER — Other Ambulatory Visit: Payer: Self-pay

## 2020-09-11 ENCOUNTER — Encounter: Payer: Self-pay | Admitting: Family Medicine

## 2020-09-11 ENCOUNTER — Ambulatory Visit: Payer: BC Managed Care – PPO | Admitting: Family Medicine

## 2020-09-11 VITALS — BP 149/92 | HR 96 | Temp 97.6°F | Ht 66.0 in | Wt 173.0 lb

## 2020-09-11 DIAGNOSIS — I10 Essential (primary) hypertension: Secondary | ICD-10-CM | POA: Diagnosis not present

## 2020-09-11 DIAGNOSIS — F430 Acute stress reaction: Secondary | ICD-10-CM | POA: Diagnosis not present

## 2020-09-11 DIAGNOSIS — F17200 Nicotine dependence, unspecified, uncomplicated: Secondary | ICD-10-CM

## 2020-09-11 MED ORDER — FLUOXETINE HCL 20 MG PO TABS: 20.0000 mg | ORAL_TABLET | Freq: Every day | ORAL | 3 refills | Status: DC

## 2020-09-11 MED ORDER — AMLODIPINE BESYLATE 5 MG PO TABS: 5.0000 mg | ORAL_TABLET | Freq: Every day | ORAL | 3 refills | Status: DC

## 2020-09-11 NOTE — Assessment & Plan Note (Signed)
bp in fair control at this time  BP Readings from Last 1 Encounters:  09/11/20 (!) 149/92  plan to inc amlodipine to 5 mg daily  Alert if side eff  inst pt to check bp only when feeling relaxed /calm Most recent labs reviewed  Disc lifstyle change with low sodium diet and exercise  F/u in 1-2 mo

## 2020-09-11 NOTE — Assessment & Plan Note (Signed)
Stressors-family Renita Papa  Struggling with anxiety and irritability  Suspect menopause changes also  Ref done for counseling  Px fluoxetine 20 mg daily (helped in the past) with opt to inc to 40 later Discussed expectations of SSRI medication including time to effectiveness and mechanism of action, also poss of side effects (early and late)- including mental fuzziness, weight or appetite change, nausea and poss of worse dep or anxiety (even suicidal thoughts)  Pt voiced understanding and will stop med and update if this occurs   F/u 1-2 mo

## 2020-09-11 NOTE — Assessment & Plan Note (Signed)
Still smoking 5 cig per day with plan to cut back more

## 2020-09-11 NOTE — Patient Instructions (Addendum)
Take care of yourself  Keep working on smoking cessation   I placed a referral for counseling  The office will get back to you   Go up on amlodipine to 5 mg daily (whole pill) Watch bp at home (when you are calm)   Get back on fluoxetine 20 mg daily for mood

## 2020-09-11 NOTE — Progress Notes (Signed)
Subjective:    Patient ID: Beth Reynolds, female    DOB: [birth year], 50 y.o.   MRN: [id number]  This visit occurred during the SARS-CoV-2 public health emergency.  Safety protocols were in place, including screening questions prior to the visit, additional usage of staff PPE, and extensive cleaning of exam room while observing appropriate contact time as indicated for disinfecting solutions.   HPI Pt presents for f/u of HTN   Wt Readings from Last 3 Encounters:  09/11/20 173 lb (78.5 kg)  08/05/20 176 lb 8 oz (80.1 kg)  01/29/20 178 lb 9.6 oz (81 kg)   27.92 kg/m  Struggling with stress level  Thinks she wants to see a counselor  Also has a 52 yo     Last visit bp was not at goal Noted that high stress level and smoking may have worsened it  We started amlodipine 2.5 mg daily  No problems   Bp is still high at home   BP Readings from Last 3 Encounters:  09/11/20 (!) 149/92  08/05/20 (!) 150/90  01/29/20 118/84    She was cutting cigarettes at that time   Lab Results  Component Value Date   CREATININE 0.77 01/22/2020   BUN 9 01/22/2020   NA 137 01/22/2020   K 4.2 01/22/2020   CL 102 01/22/2020   CO2 27 01/22/2020   Patient Active Problem List   Diagnosis Date Noted   Stress reaction 08/05/2020   Screening mammogram, encounter for 01/29/2020   Encounter for routine gynecological examination 01/29/2020   Colon cancer screening 01/29/2020   Neck pain 01/29/2020   Low back pain 02/06/2018   Essential hypertension 02/06/2018   Rash of hands 03/18/2014   IBS (irritable bowel syndrome) 01/17/2012   Prediabetes 10/09/2010   Routine general medical examination at a health care facility 10/04/2010   MENOPAUSE, EARLY 11/03/2009   Menopausal and postmenopausal disorder 10/11/2009   Migraine 01/19/2008   TOBACCO ABUSE 07/17/2007   Adjustment disorder with mixed anxiety and depressed mood 07/17/2007   Allergic rhinitis 07/17/2007   GERD 07/17/2007   Past  Medical History:  Diagnosis Date   Allergic rhinoconjunctivitis    Anxiety    Early menopause    Migraine    PMDD (premenstrual dysphoric disorder)    TB (tuberculosis), treated    Tobacco abuse    Past Surgical History:  Procedure Laterality Date   DILATION AND CURETTAGE OF UTERUS     LESION REMOVAL     from finger   Social History   Tobacco Use   Smoking status: Every Day    Packs/day: 0.25    Pack years: 0.00    Types: Cigarettes   Smokeless tobacco: Never  Substance Use Topics   Alcohol use: No    Alcohol/week: 0.0 standard drinks   Drug use: No   Family History  Problem Relation Age of Onset   Diabetes Father    Allergies  Allergen Reactions   Avocado     Swollen lips   Paroxetine     REACTION: sever headache, not able to sleep and weight gain   Pineapple     Swelling of lips   Venlafaxine     REACTION: HA   Current Outpatient Medications on File Prior to Visit  Medication Sig Dispense Refill   cetirizine (ZYRTEC) 10 MG tablet Take 10 mg by mouth daily as needed for allergies.     lansoprazole (PREVACID) 30 MG capsule TAKE 1 CAPSULE BY MOUTH EVERY DAY  90 capsule 2   No current facility-administered medications on file prior to visit.    Review of Systems  Constitutional:  Negative for activity change, appetite change, fatigue, fever and unexpected weight change.  HENT:  Negative for congestion, ear pain, rhinorrhea, sinus pressure, sore throat and trouble swallowing.   Eyes:  Negative for pain, redness, itching and visual disturbance.  Respiratory:  Negative for cough, chest tightness, shortness of breath and wheezing.   Cardiovascular:  Negative for chest pain and palpitations.  Gastrointestinal:  Negative for abdominal pain, blood in stool, constipation, diarrhea and nausea.  Endocrine: Negative for cold intolerance, heat intolerance, polydipsia and polyuria.  Genitourinary:  Positive for dyspareunia. Negative for difficulty urinating, dysuria,  frequency and urgency.  Musculoskeletal:  Negative for arthralgias, back pain, joint swelling and myalgias.  Skin:  Negative for pallor and rash.  Allergic/Immunologic: Negative for environmental allergies.  Neurological:  Negative for dizziness, tremors, syncope, weakness, numbness and headaches.  Hematological:  Negative for adenopathy. Does not bruise/bleed easily.  Psychiatric/Behavioral:  Positive for dysphoric mood and sleep disturbance. Negative for decreased concentration. The patient is nervous/anxious.       Objective:     Physical Exam Constitutional:      General: She is not in acute distress.    Appearance: Normal appearance. She is normal weight. She is not ill-appearing or diaphoretic.  HENT:     Head: Normocephalic and atraumatic.  Eyes:     General: No scleral icterus.    Conjunctiva/sclera: Conjunctivae normal.     Pupils: Pupils are equal, round, and reactive to light.  Neck:     Vascular: No carotid bruit.  Cardiovascular:     Rate and Rhythm: Regular rhythm. Tachycardia present.     Heart sounds: Normal heart sounds.  Pulmonary:     Effort: Pulmonary effort is normal. No respiratory distress.     Breath sounds: Normal breath sounds. No wheezing or rales.     Comments: Bs are mildly distant Musculoskeletal:     Cervical back: Normal range of motion. No tenderness.  Lymphadenopathy:     Cervical: No cervical adenopathy.  Skin:    General: Skin is warm and dry.     Coloration: Skin is not pale.     Findings: No erythema or rash.  Neurological:     Mental Status: She is alert.     Cranial Nerves: No cranial nerve deficit.     Coordination: Coordination normal.     Deep Tendon Reflexes: Reflexes normal.  Psychiatric:        Mood and Affect: Mood is anxious.        Cognition and Memory: Cognition and memory normal.     Comments: Pleasant  Talks candidly about stressors and symptoms          Assessment & Plan:   Problem List Items Addressed This  Visit       Cardiovascular and Mediastinum   Essential hypertension - Primary    bp in fair control at this time  BP Readings from Last 1 Encounters:  09/11/20 (!) 149/92  plan to inc amlodipine to 5 mg daily  Alert if side eff  inst pt to check bp only when feeling relaxed /calm Most recent labs reviewed  Disc lifstyle change with low sodium diet and exercise  F/u in 1-2 mo        Relevant Medications   amLODipine (NORVASC) 5 MG tablet     Other   TOBACCO ABUSE  Still smoking 5 cig per day with plan to cut back more        Stress reaction    Stressors-family Renita Papa  Struggling with anxiety and irritability  Suspect menopause changes also  Ref done for counseling  Px fluoxetine 20 mg daily (helped in the past) with opt to inc to 40 later Discussed expectations of SSRI medication including time to effectiveness and mechanism of action, also poss of side effects (early and late)- including mental fuzziness, weight or appetite change, nausea and poss of worse dep or anxiety (even suicidal thoughts)  Pt voiced understanding and will stop med and update if this occurs   F/u 1-2 mo       Relevant Medications   FLUoxetine (PROZAC) 20 MG tablet   Other Relevant Orders   Ambulatory referral to Psychology

## 2020-09-19 ENCOUNTER — Ambulatory Visit: Payer: BC Managed Care – PPO | Admitting: Family Medicine

## 2020-09-26 ENCOUNTER — Telehealth: Payer: Self-pay

## 2020-09-26 NOTE — Telephone Encounter (Signed)
Error./-see referral notes

## 2020-10-03 ENCOUNTER — Other Ambulatory Visit: Payer: Self-pay | Admitting: Family Medicine

## 2020-10-15 ENCOUNTER — Ambulatory Visit (INDEPENDENT_AMBULATORY_CARE_PROVIDER_SITE_OTHER): Payer: BC Managed Care – PPO | Admitting: Psychology

## 2020-10-15 DIAGNOSIS — F411 Generalized anxiety disorder: Secondary | ICD-10-CM | POA: Diagnosis not present

## 2020-10-22 ENCOUNTER — Ambulatory Visit (INDEPENDENT_AMBULATORY_CARE_PROVIDER_SITE_OTHER): Payer: BC Managed Care – PPO | Admitting: Psychology

## 2020-10-22 DIAGNOSIS — F411 Generalized anxiety disorder: Secondary | ICD-10-CM | POA: Diagnosis not present

## 2020-10-29 ENCOUNTER — Ambulatory Visit: Payer: BC Managed Care – PPO | Admitting: Psychology

## 2020-11-03 ENCOUNTER — Ambulatory Visit: Payer: BC Managed Care – PPO | Admitting: Psychology

## 2020-11-10 ENCOUNTER — Ambulatory Visit: Payer: BC Managed Care – PPO | Admitting: Family Medicine

## 2020-12-26 ENCOUNTER — Other Ambulatory Visit: Payer: Self-pay | Admitting: Family Medicine

## 2021-01-06 ENCOUNTER — Other Ambulatory Visit: Payer: Self-pay | Admitting: Family Medicine

## 2021-01-06 NOTE — Telephone Encounter (Signed)
Please schedule follow up with Dr Milinda Antis, over due

## 2021-01-07 NOTE — Telephone Encounter (Signed)
Med refilled once  

## 2021-01-07 NOTE — Telephone Encounter (Signed)
Called patient and got her scheduled for 10/18 @8am 

## 2021-01-13 ENCOUNTER — Ambulatory Visit: Payer: BC Managed Care – PPO | Admitting: Family Medicine

## 2021-01-20 ENCOUNTER — Ambulatory Visit: Payer: BC Managed Care – PPO | Admitting: Family Medicine

## 2021-01-20 ENCOUNTER — Other Ambulatory Visit: Payer: Self-pay

## 2021-01-20 ENCOUNTER — Encounter: Payer: Self-pay | Admitting: Family Medicine

## 2021-01-20 VITALS — BP 126/70 | HR 68 | Temp 98.1°F | Resp 16 | Ht 66.0 in | Wt 170.4 lb

## 2021-01-20 DIAGNOSIS — F430 Acute stress reaction: Secondary | ICD-10-CM | POA: Diagnosis not present

## 2021-01-20 DIAGNOSIS — F17200 Nicotine dependence, unspecified, uncomplicated: Secondary | ICD-10-CM | POA: Diagnosis not present

## 2021-01-20 DIAGNOSIS — I10 Essential (primary) hypertension: Secondary | ICD-10-CM

## 2021-01-20 NOTE — Progress Notes (Signed)
Subjective:    Patient ID: Beth Reynolds, female    DOB: Aug 03, 1970, 50 y.o.   MRN: [id number] This visit occurred during the SARS-CoV-2 public health emergency.  Safety protocols were in place, including screening questions prior to the visit, additional usage of staff PPE, and extensive cleaning of exam room while observing appropriate contact time as indicated for disinfecting solutions.   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   Has cut back smoking but not ready to give up    HTN  bp is stable today  No cp or palpitations or headaches or edema  No side effects to medicines  BP Readings from Last 3 Encounters:  01/20/21 126/70  09/11/20 (!) 149/92  08/05/20 (!) 150/90     Last visit we inc amlodipine to 5 mg daily  Encouraged smoking cessation    Lab Results  Component Value Date   CREATININE 0.77 01/22/2020   BUN 9 01/22/2020   NA 137 01/22/2020   K 4.2 01/22/2020   CL 102 01/22/2020   CO2 27 01/22/2020     Stress reaction  Last visit started fluoxetine 20 mg daily  (no side effects)  Would like to stay at this dose  Referral was done for counseling   Mood is better overall  More positive and more relaxed  Changed her mind set re: how she views things  Patient with herself   Several visit with counselor over the summer  Now with school (work) -she has a hard time connecting  Does it virtually  It was helpful but in the future would prefer face to face    Patient Active Problem List   Diagnosis Date Noted   Stress reaction 08/05/2020   Screening mammogram, encounter for 01/29/2020   Encounter for routine gynecological examination 01/29/2020    Colon cancer screening 01/29/2020   Neck pain 01/29/2020   Low back pain 02/06/2018   Essential hypertension 02/06/2018   Rash of hands 03/18/2014   IBS (irritable bowel syndrome) 01/17/2012   Prediabetes 10/09/2010   Routine general medical examination at a health care facility 10/04/2010   MENOPAUSE, EARLY 11/03/2009   Menopausal and postmenopausal disorder 10/11/2009   Migraine 01/19/2008   TOBACCO ABUSE 07/17/2007   Adjustment disorder with mixed anxiety and depressed mood 07/17/2007   Allergic rhinitis 07/17/2007   GERD 07/17/2007   Past Medical History:  Diagnosis Date   Allergic rhinoconjunctivitis    Anxiety    Early menopause    Migraine    PMDD (premenstrual dysphoric disorder)    TB (tuberculosis), treated    Tobacco abuse    Past Surgical History:  Procedure Laterality Date   DILATION AND CURETTAGE OF UTERUS     LESION REMOVAL     from finger   Social History   Tobacco Use   Smoking status: Every Day    Packs/day: 0.25    Types: Cigarettes   Smokeless tobacco: Never  Substance Use Topics   Alcohol use: No    Alcohol/week: 0.0 standard drinks   Drug use: No   Family History  Problem Relation Age of Onset  Diabetes Father    Allergies  Allergen Reactions   Avocado     Swollen lips   Paroxetine     REACTION: sever headache, not able to sleep and weight gain   Pineapple     Swelling of lips   Venlafaxine     REACTION: HA   Current Outpatient Medications on File Prior to Visit  Medication Sig Dispense Refill   amLODipine (NORVASC) 5 MG tablet Take 1 tablet (5 mg total) by mouth daily. 30 tablet 3   cetirizine (ZYRTEC) 10 MG tablet Take 10 mg by mouth daily as needed for allergies.     FLUoxetine (PROZAC) 20 MG tablet TAKE 1 TABLET BY MOUTH EVERY DAY 90 tablet 0   lansoprazole (PREVACID) 30 MG capsule TAKE 1 CAPSULE BY MOUTH EVERY DAY 90 capsule 1   No current facility-administered medications on file prior to visit.    Review of Systems   Constitutional:  Negative for activity change, appetite change, fatigue, fever and unexpected weight change.  HENT:  Negative for congestion, ear pain, rhinorrhea, sinus pressure and sore throat.   Eyes:  Negative for pain, redness and visual disturbance.  Respiratory:  Negative for cough, shortness of breath and wheezing.   Cardiovascular:  Negative for chest pain and palpitations.  Gastrointestinal:  Negative for abdominal pain, blood in stool, constipation and diarrhea.  Endocrine: Negative for polydipsia and polyuria.  Genitourinary:  Negative for dysuria, frequency and urgency.  Musculoskeletal:  Negative for arthralgias, back pain and myalgias.  Skin:  Negative for pallor and rash.  Allergic/Immunologic: Negative for environmental allergies.  Neurological:  Negative for dizziness, syncope and headaches.  Hematological:  Negative for adenopathy. Does not bruise/bleed easily.  Psychiatric/Behavioral:  Negative for decreased concentration, dysphoric mood, sleep disturbance and suicidal ideas. The patient is nervous/anxious.        Mood is better  Less anxious and more relaxed      Objective:   Physical Exam Constitutional:      General: She is not in acute distress.    Appearance: Normal appearance. She is well-developed and normal weight.  HENT:     Head: Normocephalic and atraumatic.  Eyes:     Conjunctiva/sclera: Conjunctivae normal.     Pupils: Pupils are equal, round, and reactive to light.  Neck:     Thyroid: No thyromegaly.     Vascular: No carotid bruit or JVD.  Cardiovascular:     Rate and Rhythm: Normal rate and regular rhythm.     Heart sounds: Normal heart sounds.    No gallop.  Pulmonary:     Effort: Pulmonary effort is normal. No respiratory distress.     Breath sounds: Normal breath sounds. No wheezing or rales.  Abdominal:     General: Abdomen is flat. There is no abdominal bruit.     Palpations: There is no mass.  Musculoskeletal:     Cervical back:  Normal range of motion and neck supple.     Right lower leg: No edema.     Left lower leg: No edema.  Lymphadenopathy:     Cervical: No cervical adenopathy.  Skin:    General: Skin is warm and dry.     Coloration: Skin is not pale.     Findings: No rash.  Neurological:     Mental Status: She is alert.     Coordination: Coordination normal.     Deep Tendon Reflexes: Reflexes are normal and symmetric. Reflexes normal.  Psychiatric:  Attention and Perception: Attention normal.        Mood and Affect: Mood normal.        Speech: Speech normal.        Cognition and Memory: Cognition and memory normal.     Comments: Much less anxious  Candidly discusses her symptoms and stressors            Assessment & Plan:   Problem List Items Addressed This Visit       Cardiovascular and Mediastinum   Essential hypertension - Primary    Improved with amlodipine (also better anxiety control) bp in fair control at this time  BP Readings from Last 1 Encounters:  01/20/21 126/70   No changes needed Most recent labs reviewed  Disc lifstyle change with low sodium diet and exercise          Other   TOBACCO ABUSE    Pt has been able to cut back since less anxious Not ready to quit yet   Disc in detail risks of smoking and possible outcomes including copd, vascular/ heart disease, cancer , respiratory and sinus infections  Pt voices understanding       Stress reaction    Significantly improved so far with fluoxetine 20 mg daily and counseling Does not wish to go up on dose  Reviewed stressors/ coping techniques/symptoms/ support sources/ tx options and side effects in detail today  May want an in person counselor in the future  Enc pt to update [place] if worse or any changes

## 2021-01-20 NOTE — Assessment & Plan Note (Signed)
Improved with amlodipine (also better anxiety control) bp in fair control at this time  BP Readings from Last 1 Encounters:  01/20/21 126/70   No changes needed Most recent labs reviewed  Disc lifstyle change with low sodium diet and exercise

## 2021-01-20 NOTE — Assessment & Plan Note (Signed)
Significantly improved so far with fluoxetine 20 mg daily and counseling Does not wish to go up on dose  Reviewed stressors/ coping techniques/symptoms/ support sources/ tx options and side effects in detail today  May want an in person counselor in the future  Enc pt to update [place] if worse or any changes

## 2021-01-20 NOTE — Assessment & Plan Note (Signed)
Pt has been able to cut back since less anxious Not ready to quit yet   Disc in detail risks of smoking and possible outcomes including copd, vascular/ heart disease, cancer , respiratory and sinus infections  Pt voices understanding

## 2021-01-20 NOTE — Patient Instructions (Addendum)
Take care of yourself  Try to reserve time to do things for yourself   Continue your current medicines   Continue counseling if you can manage it (and if it is helpful)

## 2021-03-06 ENCOUNTER — Other Ambulatory Visit: Payer: Self-pay | Admitting: Family Medicine

## 2021-04-07 ENCOUNTER — Other Ambulatory Visit: Payer: Self-pay | Admitting: Family Medicine

## 2021-05-25 ENCOUNTER — Other Ambulatory Visit: Payer: Self-pay

## 2021-05-25 ENCOUNTER — Ambulatory Visit: Payer: BC Managed Care – PPO | Admitting: Family Medicine

## 2021-05-25 ENCOUNTER — Encounter: Payer: Self-pay | Admitting: Family Medicine

## 2021-05-25 VITALS — BP 122/80 | HR 97 | Temp 97.7°F | Ht 66.0 in | Wt 170.0 lb

## 2021-05-25 DIAGNOSIS — M545 Low back pain, unspecified: Secondary | ICD-10-CM | POA: Diagnosis not present

## 2021-05-25 DIAGNOSIS — I10 Essential (primary) hypertension: Secondary | ICD-10-CM | POA: Diagnosis not present

## 2021-05-25 MED ORDER — METAXALONE 800 MG PO TABS: 800.0000 mg | ORAL_TABLET | Freq: Three times a day (TID) | ORAL | 1 refills | Status: DC | PRN

## 2021-05-25 MED ORDER — PREDNISONE 10 MG PO TABS: ORAL_TABLET | ORAL | 0 refills | Status: DC

## 2021-05-25 NOTE — Patient Instructions (Addendum)
Keep doing your exercises for the back  I will refer you to orthopedics-you will get a call  If you do not hear about the ortho referral in 2 weeks please call us back   Take prednisone as directed  (hold the ibuprofen when on prednisone and then you can start it back)   Take the muscle relaxer as needed    For blood pressure  Eat less processed foods (less sodium) Exercise daily for at least 30 minutes Try to quit smoking   If you blood pressure runs under 90/50 or if you feel dizzy do check it and keep [place] posted

## 2021-05-25 NOTE — Assessment & Plan Note (Signed)
bp in fair control at this time  BP Readings from Last 1 Encounters:  05/25/21 122/80   No changes needed Most recent labs reviewed  Disc lifstyle change with low sodium diet and exercise  Plans to stick with 2.5 mg amlodipine daily  Enc smoking cessation

## 2021-05-25 NOTE — Progress Notes (Signed)
Subjective:    Patient ID: Beth Reynolds, female    DOB: 10-18-1970, 51 y.o.   MRN: [id number]  This visit occurred during the SARS-CoV-2 public health emergency.  Safety protocols were in place, including screening questions prior to the visit, additional usage of staff PPE, and extensive cleaning of exam room while observing appropriate contact time as indicated for disinfecting solutions.   HPI Pt presents for c/o back pain   Wt Readings from Last 3 Encounters:  05/25/21 170 lb (77.1 kg)  01/20/21 170 lb 6.4 oz (77.3 kg)  09/11/20 173 lb (78.5 kg)   27.44 kg/m   H/o low back pain  Deg changes on xray in the past  Prednisone and muscle relaxer given last may along with rehab suggestions  Orthopedics referral was done but then she did not go  Could not get out of work to go   Same pain R lower back  It does not shoot down leg  Has to change position carefully  Has to avoid lifting  Wearing a lumbar support belt and it helps   The prednisone and muscle relaxer helped last may  She does the rehab exercises  Also chair yoga  Uses some heat   Pain is constant  Is dull in nature  No neuro changes   Walking is fine  Bending forward is worse than bending back   The cobra pose helps the most    HTN bp is stable today  No cp or palpitations or headaches or edema  No side effects to medicines  BP Readings from Last 3 Encounters:  05/25/21 122/80  01/20/21 126/70  09/11/20 (!) 149/92     Amlodipine 5 mg-up to 1 then back down to 1/2  Needs that sent in   Patient Active Problem List   Diagnosis Date Noted   Stress reaction 08/05/2020   Screening mammogram, encounter for 01/29/2020   Encounter for routine gynecological examination 01/29/2020   Colon cancer screening 01/29/2020   Neck pain 01/29/2020   Low back pain 02/06/2018   Essential hypertension 02/06/2018   Rash of hands 03/18/2014   IBS (irritable bowel syndrome) 01/17/2012   Prediabetes  10/09/2010   Routine general medical examination at a health care facility 10/04/2010   MENOPAUSE, EARLY 11/03/2009   Menopausal and postmenopausal disorder 10/11/2009   Migraine 01/19/2008   TOBACCO ABUSE 07/17/2007   Adjustment disorder with mixed anxiety and depressed mood 07/17/2007   Allergic rhinitis 07/17/2007   GERD 07/17/2007   Past Medical History:  Diagnosis Date   Allergic rhinoconjunctivitis    Anxiety    Early menopause    Migraine    PMDD (premenstrual dysphoric disorder)    TB (tuberculosis), treated    Tobacco abuse    Past Surgical History:  Procedure Laterality Date   DILATION AND CURETTAGE OF UTERUS     LESION REMOVAL     from finger   Social History   Tobacco Use   Smoking status: Every Day    Packs/day: 0.25    Types: Cigarettes    Passive exposure: Past   Smokeless tobacco: Never  Substance Use Topics   Alcohol use: No    Alcohol/week: 0.0 standard drinks   Drug use: No   Family History  Problem Relation Age of Onset   Diabetes Father    Allergies  Allergen Reactions   Avocado     Swollen lips   Paroxetine     REACTION: sever headache, not able to sleep  and weight gain   Pineapple     Swelling of lips   Venlafaxine     REACTION: HA   Current Outpatient Medications on File Prior to Visit  Medication Sig Dispense Refill   amLODipine (NORVASC) 5 MG tablet TAKE 1/2 TABLET BY MOUTH DAILY 45 tablet 2   cetirizine (ZYRTEC) 10 MG tablet Take 10 mg by mouth daily as needed for allergies.     FLUoxetine (PROZAC) 20 MG tablet TAKE 1 TABLET BY MOUTH EVERY DAY 90 tablet 0   lansoprazole (PREVACID) 30 MG capsule TAKE 1 CAPSULE BY MOUTH EVERY DAY 90 capsule 1   No current facility-administered medications on file prior to visit.    Review of Systems  Constitutional:  Negative for activity change, appetite change, fatigue, fever and unexpected weight change.  HENT:  Negative for congestion, ear pain, rhinorrhea, sinus pressure and sore throat.    Eyes:  Negative for pain, redness and visual disturbance.  Respiratory:  Negative for cough, shortness of breath and wheezing.   Cardiovascular:  Negative for chest pain and palpitations.  Gastrointestinal:  Negative for abdominal pain, blood in stool, constipation and diarrhea.  Endocrine: Negative for polydipsia and polyuria.  Genitourinary:  Negative for dysuria, frequency and urgency.  Musculoskeletal:  Positive for back pain. Negative for arthralgias and myalgias.  Skin:  Negative for pallor and rash.  Allergic/Immunologic: Negative for environmental allergies.  Neurological:  Negative for dizziness, syncope and headaches.  Hematological:  Negative for adenopathy. Does not bruise/bleed easily.  Psychiatric/Behavioral:  Negative for decreased concentration and dysphoric mood. The patient is not nervous/anxious.       Objective:   Physical Exam Constitutional:      General: She is not in acute distress.    Appearance: Normal appearance. She is well-developed and normal weight. She is not ill-appearing.  HENT:     Head: Normocephalic and atraumatic.  Eyes:     General: No scleral icterus.    Conjunctiva/sclera: Conjunctivae normal.     Pupils: Pupils are equal, round, and reactive to light.  Cardiovascular:     Rate and Rhythm: Regular rhythm. Tachycardia present.  Pulmonary:     Effort: Pulmonary effort is normal.     Breath sounds: Normal breath sounds. No wheezing or rales.  Abdominal:     General: Bowel sounds are normal. There is no distension.     Palpations: Abdomen is soft.     Tenderness: There is no abdominal tenderness.  Musculoskeletal:        General: Tenderness present.     Cervical back: Normal range of motion and neck supple.     Lumbar back: Spasms and tenderness present. No edema or bony tenderness. Decreased range of motion.     Comments: Tender in R lumbar musculature (with tight muscles) No bony tenderness Some back pain with SLR  Nl rom of hips- R  int rotation bothers her back a bit   Nl gait  Flex- 40 deg with pain Ext-full  Some pain on R lateral bend      Lymphadenopathy:     Cervical: No cervical adenopathy.  Skin:    General: Skin is warm and dry.     Coloration: Skin is not pale.     Findings: No erythema or rash.  Neurological:     Mental Status: She is alert.     Cranial Nerves: No cranial nerve deficit.     Sensory: No sensory deficit.     Motor: No atrophy or  abnormal muscle tone.     Coordination: Coordination normal.     Deep Tendon Reflexes: Reflexes are normal and symmetric.     Comments: Negative SLR          Assessment & Plan:   Problem List Items Addressed This Visit       Cardiovascular and Mediastinum   Essential hypertension    bp in fair control at this time  BP Readings from Last 1 Encounters:  05/25/21 122/80  No changes needed Most recent labs reviewed  Disc lifstyle change with low sodium diet and exercise  Plans to stick with 2.5 mg amlodipine daily  Enc smoking cessation         Other   Low back pain - Primary    R side  Recurrent/chronic  Reassuring exam  No neuro changes  Orthopedic ref ([place]) done given chronicity Heat/belt/stretches Prednisone 30 mg taper (rev side eff)  Skelaxin with caution of sedation   Update if not starting to improve in a week or if worsening        Relevant Medications   predniSONE (DELTASONE) 10 MG tablet   metaxalone (SKELAXIN) 800 MG tablet   Other Relevant Orders   Ambulatory referral to Orthopedic Surgery

## 2021-05-25 NOTE — Assessment & Plan Note (Signed)
R side  Recurrent/chronic  Reassuring exam  No neuro changes  Orthopedic ref ([place]) done given chronicity Heat/belt/stretches Prednisone 30 mg taper (rev side eff)  Skelaxin with caution of sedation   Update if not starting to improve in a week or if worsening

## 2021-06-01 ENCOUNTER — Encounter: Payer: Self-pay | Admitting: *Deleted

## 2021-06-29 ENCOUNTER — Other Ambulatory Visit: Payer: Self-pay | Admitting: Family Medicine

## 2021-07-29 ENCOUNTER — Ambulatory Visit: Payer: BC Managed Care – PPO | Admitting: Family Medicine

## 2021-08-04 ENCOUNTER — Ambulatory Visit: Payer: BC Managed Care – PPO | Admitting: Family Medicine

## 2021-08-04 ENCOUNTER — Encounter: Payer: Self-pay | Admitting: Family Medicine

## 2021-08-04 VITALS — BP 138/80 | HR 104 | Temp 97.5°F | Ht 66.0 in | Wt 173.2 lb

## 2021-08-04 DIAGNOSIS — M5441 Lumbago with sciatica, right side: Secondary | ICD-10-CM | POA: Diagnosis not present

## 2021-08-04 DIAGNOSIS — I10 Essential (primary) hypertension: Secondary | ICD-10-CM | POA: Diagnosis not present

## 2021-08-04 DIAGNOSIS — F17200 Nicotine dependence, unspecified, uncomplicated: Secondary | ICD-10-CM

## 2021-08-04 DIAGNOSIS — F430 Acute stress reaction: Secondary | ICD-10-CM | POA: Diagnosis not present

## 2021-08-04 MED ORDER — HYDROCHLOROTHIAZIDE 25 MG PO TABS: 25.0000 mg | ORAL_TABLET | Freq: Every day | ORAL | 3 refills | Status: DC

## 2021-08-04 NOTE — Progress Notes (Signed)
? ?Subjective:  ? ? Patient ID: Beth Reynolds, female    DOB: 1970-10-09, 51 y.o.   MRN: [id number] ? ?HPI ?Pt presents for BP issues/ HTN and back pain  ? ?Wt Readings from Last 3 Encounters:  ?08/04/21 173 lb 4 oz (78.6 kg)  ?05/25/21 170 lb (77.1 kg)  ?01/20/21 170 lb 6.4 oz (77.3 kg)  ? ?27.96 kg/m? ? ?Went to [place] and came back from that trip and her back got worse  ? ?She took left over oxycodone, hydrocodone (left over from [facility])  ?Still had prednisone at that time also  ?R back -pain is wrapping around to her hip  ? ? ? ?Bp went up (top and bottom)  ?She took her bp medicine and it stayed high  ?Last week starting Tuesday she took 5 mg of amlodipine instead of 2.5  ? ?Feels lousy , still a headache  ? ?Off prednisone for about 10 days  ? ? ?HTN ?No cp or palpitations or headaches or edema  ?No side effects to medicines  ?BP Readings from Last 3 Encounters:  ?08/04/21 (!) 152/90  ?05/25/21 122/80  ?01/20/21 126/70  ?  ?Last bp at home was last night 153/80  ? ? ? ?Amlodipine 2.5 mg daily  ? ?Smoking status :  cut down a lot but still smokes  ?Is down to 6 cig per day  ?Does not smoke at all at work  ? ? ?H/o low back pain  ?Deg changes on xr in the past  ?R side and it shoots down her leg  ?Has had prednisone and muscle relaxers and therapy ? ?Ref to ortho done at last visit in late feb ? ?Patient Active Problem List  ? Diagnosis Date Noted  ? Stress reaction 08/05/2020  ? Screening mammogram, encounter for 01/29/2020  ? Encounter for routine gynecological examination 01/29/2020  ? Colon cancer screening 01/29/2020  ? Neck pain 01/29/2020  ? Low back pain 02/06/2018  ? Essential hypertension 02/06/2018  ? Rash of hands 03/18/2014  ? IBS (irritable bowel syndrome) 01/17/2012  ? Prediabetes 10/09/2010  ? Routine general medical examination at a health care facility 10/04/2010  ? MENOPAUSE, EARLY 11/03/2009  ? Menopausal and postmenopausal disorder 10/11/2009  ? Migraine 01/19/2008  ? TOBACCO ABUSE 07/17/2007  ?  Adjustment disorder with mixed anxiety and depressed mood 07/17/2007  ? Allergic rhinitis 07/17/2007  ? GERD 07/17/2007  ? ?Past Medical History:  ?Diagnosis Date  ? Allergic rhinoconjunctivitis   ? Anxiety   ? Early menopause   ? Migraine   ? PMDD (premenstrual dysphoric disorder)   ? TB (tuberculosis), treated   ? Tobacco abuse   ? ?Past Surgical History:  ?Procedure Laterality Date  ? DILATION AND CURETTAGE OF UTERUS    ? LESION REMOVAL    ? from finger  ? ?Social History  ? ?Tobacco Use  ? Smoking status: Every Day  ?  Packs/day: 0.25  ?  Types: Cigarettes  ?  Passive exposure: Past  ? Smokeless tobacco: Never  ?Substance Use Topics  ? Alcohol use: No  ?  Alcohol/week: 0.0 standard drinks  ? Drug use: No  ? ?Family History  ?Problem Relation Age of Onset  ? Diabetes Father   ? ?Allergies  ?Allergen Reactions  ? Avocado   ?  Swollen lips  ? Paroxetine   ?  REACTION: sever headache, not able to sleep and weight gain  ? Pineapple   ?  Swelling of lips  ? Venlafaxine   ?  REACTION: HA  ? ?Current Outpatient Medications on File Prior to Visit  ?Medication Sig Dispense Refill  ? amLODipine (NORVASC) 5 MG tablet TAKE 1/2 TABLET BY MOUTH DAILY 45 tablet 2  ? cetirizine (ZYRTEC) 10 MG tablet Take 10 mg by mouth daily as needed for allergies.    ? FLUoxetine (PROZAC) 20 MG tablet TAKE 1 TABLET BY MOUTH EVERY DAY 90 tablet 1  ? lansoprazole (PREVACID) 30 MG capsule TAKE 1 CAPSULE BY MOUTH EVERY DAY 90 capsule 1  ? metaxalone (SKELAXIN) 800 MG tablet Take 1 tablet (800 mg total) by mouth 3 (three) times daily as needed for muscle spasms. Caution of sedation 60 tablet 1  ? ?No current facility-administered medications on file prior to visit.  ?  ? ?Review of Systems  ?Constitutional:  Negative for activity change, appetite change, fatigue, fever and unexpected weight change.  ?HENT:  Negative for congestion, ear pain, rhinorrhea, sinus pressure and sore throat.   ?Eyes:  Negative for pain, redness and visual disturbance.   ?Respiratory:  Negative for cough, shortness of breath and wheezing.   ?Cardiovascular:  Negative for chest pain and palpitations.  ?Gastrointestinal:  Negative for abdominal pain, blood in stool, constipation and diarrhea.  ?Endocrine: Negative for polydipsia and polyuria.  ?Genitourinary:  Negative for dysuria, frequency and urgency.  ?Musculoskeletal:  Positive for back pain. Negative for arthralgias and myalgias.  ?Skin:  Negative for pallor and rash.  ?Allergic/Immunologic: Negative for environmental allergies.  ?Neurological:  Positive for headaches. Negative for dizziness and syncope.  ?Hematological:  Negative for adenopathy. Does not bruise/bleed easily.  ?Psychiatric/Behavioral:  Negative for decreased concentration and dysphoric mood. The patient is nervous/anxious.   ? ?   ?Objective:  ? Physical Exam ?Constitutional:   ?   General: She is not in acute distress. ?   Appearance: Normal appearance. She is well-developed and normal weight. She is not ill-appearing or diaphoretic.  ?HENT:  ?   Head: Normocephalic and atraumatic.  ?Eyes:  ?   Conjunctiva/sclera: Conjunctivae normal.  ?   Pupils: Pupils are equal, round, and reactive to light.  ?Neck:  ?   Thyroid: No thyromegaly.  ?   Vascular: No carotid bruit or JVD.  ?Cardiovascular:  ?   Rate and Rhythm: Normal rate and regular rhythm.  ?   Heart sounds: Normal heart sounds.  ?  No gallop.  ?Pulmonary:  ?   Effort: Pulmonary effort is normal. No respiratory distress.  ?   Breath sounds: Normal breath sounds. No wheezing or rales.  ?   Comments: Bs are mildly distant today  ?No wheezing  ?Abdominal:  ?   General: There is no distension or abdominal bruit.  ?   Palpations: Abdomen is soft.  ?Musculoskeletal:  ?   Cervical back: Normal range of motion and neck supple.  ?   Right lower leg: No edema.  ?   Left lower leg: No edema.  ?   Comments: Limited rom of LS ?Gait is normal but slow due to discomfort   ?Lymphadenopathy:  ?   Cervical: No cervical  adenopathy.  ?Skin: ?   General: Skin is warm and dry.  ?   Coloration: Skin is not pale.  ?   Findings: No rash.  ?Neurological:  ?   Mental Status: She is alert.  ?   Coordination: Coordination normal.  ?   Deep Tendon Reflexes: Reflexes are normal and symmetric. Reflexes normal.  ?Psychiatric:     ?   Mood  and Affect: Mood is anxious.     ?   Speech: Speech normal.     ?   Cognition and Memory: Cognition and memory normal.  ?   Comments: Pleasant   ? ? ? ? ? ?   ?Assessment & Plan:  ? ?Problem List Items Addressed This Visit   ? ?  ? Cardiovascular and Mediastinum  ? Essential hypertension - Primary  ?  bp is up in the setting of back pain  ?Last prednisone dose was 10 d ago  ?occ ibuprofen  ?Has cut back on cigarettes ?Some anxiety  ? ?Plan to continue amlodipine at 5 mg for now  ?Add hctz 25 mg daily (rev inc urination) and inst to call if side eff ?May be able to then cut back amlodipine to 5 mg daily  ?Repeat bp here today is better  ?BP: 138/80  ?Some headaches  ? ?F/u 2 wk for visit and lab  ?Rev lifestyle habits  ? ?  ?  ? Relevant Medications  ? hydrochlorothiazide (HYDRODIURIL) 25 MG tablet  ?  ? Other  ? Low back pain  ?  R sided, some referred pain to lateral hip/buttock area but no neuro changes ?Had a bad bout and took narcotic med from UC (?)  ?Did not like how she felt  ?Finished prednisone 10 d ago  ?Referral done last time/she missed the mychart message on march 6 to call [facility] and make her appt  ?She will do that now ?ER parameters noted  ? ?  ?  ? Stress reaction  ?  More anxious today  ?No doubt both prednisone and pain worsened that  ?May be adding to bp  ?Also worry about bp is making anx worse  ?Plan to continue fluoxetine 20 mg daily for now ?If not improving in the next several weeks could consider inc dose or adding  ?Encouraged self care ? ?  ?  ? TOBACCO ABUSE  ?  Has cut down to 6 cig daily  ?No smoking at work at all  ?Commended ?She will continue cutting back  ?No entirely  ready to quit  ? ?  ?  ? ? ? ?

## 2021-08-04 NOTE — Assessment & Plan Note (Signed)
More anxious today  ?No doubt both prednisone and pain worsened that  ?May be adding to bp  ?Also worry about bp is making anx worse  ?Plan to continue fluoxetine 20 mg daily for now ?If not improving in the next several weeks could consider inc dose or adding  ?Encouraged self care ?

## 2021-08-04 NOTE — Patient Instructions (Addendum)
Check some blood pressures at different times of day when you feel more relaxed     ?BP right before you take your medicine is going to be the highest  ? ?Take care of yourself  ?Drink fluids ?Avoid excessive sodium  ? ?Keep thinking about quitting smoking ? ? ?Continue the amlodipine 5 mg daily  ?Add hctz 25 mg one pill daily in the am  ?If any side effects or problems please let us know  ?It will make you urinate more right after taking it  ? ?As bp improves we should be able to cut the amlodipine back to 1/2 pill  ? ?Follow up here in about 2 weeks for visit and labs  ? ? ? ? ?This is the message from march 6th: ? ?Good afternoon,  ?  ?I have sent your Ortho referral to [facility] for their office to review. ?  ?They will contact you to schedule. If you haven't heard anything from within 1 week, You may also give their office a call in a few days to schedule.  ?  ?[facility] ?Address: [address] ?[place], [place] [address] ?Phone: [phone] ?  ?If there are any issues with this please let us know. ?  ?  ?Have a good day! ?Nadara Eaton ?Referral Coordinator/CMA ?

## 2021-08-04 NOTE — Assessment & Plan Note (Signed)
R sided, some referred pain to lateral hip/buttock area but no neuro changes ?Had a bad bout and took narcotic med from [facility] (?)  ?Did not like how she felt  ?Finished prednisone 10 d ago  ?Referral done last time/she missed the mychart message on march 6 to call [facility] and make her appt  ?She will do that now ?ER parameters noted  ?

## 2021-08-04 NOTE — Assessment & Plan Note (Signed)
bp is up in the setting of back pain  ?Last prednisone dose was 10 d ago  ?occ ibuprofen  ?Has cut back on cigarettes ?Some anxiety  ? ?Plan to continue amlodipine at 5 mg for now  ?Add hctz 25 mg daily (rev inc urination) and inst to call if side eff ?May be able to then cut back amlodipine to 5 mg daily  ?Repeat bp here today is better  ?BP: 138/80  ?Some headaches  ? ?F/u 2 wk for visit and lab  ?Rev lifestyle habits  ?

## 2021-08-04 NOTE — Assessment & Plan Note (Signed)
Has cut down to 6 cig daily  ?No smoking at work at all  ?Commended ?She will continue cutting back  ?No entirely ready to quit  ?

## 2021-08-18 ENCOUNTER — Ambulatory Visit: Payer: BC Managed Care – PPO | Admitting: Family Medicine

## 2021-08-19 ENCOUNTER — Ambulatory Visit: Payer: BC Managed Care – PPO | Admitting: Family Medicine

## 2021-08-19 ENCOUNTER — Encounter: Payer: Self-pay | Admitting: Family Medicine

## 2021-08-19 VITALS — BP 140/80 | HR 95 | Temp 98.0°F | Ht 66.0 in | Wt 176.5 lb

## 2021-08-19 DIAGNOSIS — I10 Essential (primary) hypertension: Secondary | ICD-10-CM | POA: Diagnosis not present

## 2021-08-19 DIAGNOSIS — M5441 Lumbago with sciatica, right side: Secondary | ICD-10-CM

## 2021-08-19 DIAGNOSIS — F17200 Nicotine dependence, unspecified, uncomplicated: Secondary | ICD-10-CM

## 2021-08-19 LAB — BASIC METABOLIC PANEL
BUN: 18 mg/dL (ref 6–23)
CO2: 29 mEq/L (ref 19–32)
Calcium: 9.3 mg/dL (ref 8.4–10.5)
Chloride: 99 mEq/L (ref 96–112)
Creatinine, Ser: 0.85 mg/dL (ref 0.40–1.20)
GFR: 78.31 mL/min (ref >60.00)
Glucose, Bld: 85 mg/dL (ref 70–99)
Potassium: 3.4 mEq/L — ABNORMAL LOW (ref 3.5–5.1)
Sodium: 139 mEq/L (ref 135–145)

## 2021-08-19 MED ORDER — HYDROCHLOROTHIAZIDE 25 MG PO TABS: 25.0000 mg | ORAL_TABLET | Freq: Every day | ORAL | 3 refills | Status: DC

## 2021-08-19 MED ORDER — AMLODIPINE BESYLATE 5 MG PO TABS: 5.0000 mg | ORAL_TABLET | Freq: Every day | ORAL | 3 refills | Status: DC

## 2021-08-19 NOTE — Assessment & Plan Note (Signed)
bp in fair control at this time  BP Readings from Last 1 Encounters:  08/19/21 140/80  will expect improvement when done with her prednisone and meloxicam  No changes needed Continue hctz 25 mg daily  Amlodipine 5 mg daily  Tolerating both  Most recent labs reviewed  Disc lifstyle change with low sodium diet and exercise  Strongly encourage to quit smoking

## 2021-08-19 NOTE — Progress Notes (Signed)
Subjective:    Patient ID: Beth Reynolds, female    DOB: [birth year], 51 y.o.   MRN: [id number]  HPI Pt presents for f/u of HTN  Wt Readings from Last 3 Encounters:  08/19/21 176 lb 8 oz (80.1 kg)  08/04/21 173 lb 4 oz (78.6 kg)  05/25/21 170 lb (77.1 kg)   28.49 kg/m   Last visit on 5/9 bp was up and she had headaches  No cp or palpitations or edema  No side effects to medicines  BP Readings from Last 3 Encounters:  08/19/21 140/80  08/04/21 138/80  05/25/21 122/80     Planned to continue amlodipine 5 mg daily  Add hctz 25 mg daily  No side effects - not urinating much more   Trying not to eat salty foods   No ankle swelling  Stress is not too back  Still has pounding in her head /not really pain  Improved but still there   Has not checked her bp   Lab Results  Component Value Date   CREATININE 0.77 01/22/2020   BUN 9 01/22/2020   NA 137 01/22/2020   K 4.2 01/22/2020   CL 102 01/22/2020   CO2 27 01/22/2020   Smoking status : no changes   Is on steroid taper and meloxicam from ortho  To start PT in 2 weeks   Patient Active Problem List   Diagnosis Date Noted   Stress reaction 08/05/2020   Screening mammogram, encounter for 01/29/2020   Encounter for routine gynecological examination 01/29/2020   Colon cancer screening 01/29/2020   Neck pain 01/29/2020   Low back pain 02/06/2018   Essential hypertension 02/06/2018   IBS (irritable bowel syndrome) 01/17/2012   Prediabetes 10/09/2010   Routine general medical examination at a health care facility 10/04/2010   MENOPAUSE, EARLY 11/03/2009   Menopausal and postmenopausal disorder 10/11/2009   Migraine 01/19/2008   TOBACCO ABUSE 07/17/2007   Adjustment disorder with mixed anxiety and depressed mood 07/17/2007   Allergic rhinitis 07/17/2007   GERD 07/17/2007   Past Medical History:  Diagnosis Date   Allergic rhinoconjunctivitis    Anxiety    Early menopause    Migraine    PMDD  (premenstrual dysphoric disorder)    TB (tuberculosis), treated    Tobacco abuse    Past Surgical History:  Procedure Laterality Date   DILATION AND CURETTAGE OF UTERUS     LESION REMOVAL     from finger   Social History   Tobacco Use   Smoking status: Every Day    Packs/day: 0.25    Types: Cigarettes    Passive exposure: Past   Smokeless tobacco: Never  Substance Use Topics   Alcohol use: No    Alcohol/week: 0.0 standard drinks   Drug use: No   Family History  Problem Relation Age of Onset   Diabetes Father    Allergies  Allergen Reactions   Avocado     Swollen lips   Paroxetine     REACTION: sever headache, not able to sleep and weight gain   Pineapple     Swelling of lips   Venlafaxine     REACTION: HA   Current Outpatient Medications on File Prior to Visit  Medication Sig Dispense Refill   cetirizine (ZYRTEC) 10 MG tablet Take 10 mg by mouth daily as needed for allergies.     FLUoxetine (PROZAC) 20 MG tablet TAKE 1 TABLET BY MOUTH EVERY DAY 90 tablet 1   lansoprazole (PREVACID)  30 MG capsule TAKE 1 CAPSULE BY MOUTH EVERY DAY 90 capsule 1   meloxicam (MOBIC) 15 MG tablet Take 15 mg by mouth daily.     metaxalone (SKELAXIN) 800 MG tablet Take 1 tablet (800 mg total) by mouth 3 (three) times daily as needed for muscle spasms. Caution of sedation 60 tablet 1   predniSONE (STERAPRED UNI-PAK 21 TAB) 10 MG (21) TBPK tablet Take by mouth as directed. Taper dose     No current facility-administered medications on file prior to visit.    Review of Systems  Constitutional:  Negative for activity change, appetite change, fatigue, fever and unexpected weight change.  HENT:  Negative for congestion, ear pain, rhinorrhea, sinus pressure and sore throat.   Eyes:  Negative for pain, redness and visual disturbance.  Respiratory:  Negative for cough, shortness of breath and wheezing.   Cardiovascular:  Negative for chest pain and palpitations.  Gastrointestinal:  Negative for  abdominal pain, blood in stool, constipation and diarrhea.  Endocrine: Negative for polydipsia and polyuria.  Genitourinary:  Negative for dysuria, frequency and urgency.  Musculoskeletal:  Negative for arthralgias, back pain and myalgias.       Low back and hip pain   Skin:  Negative for pallor and rash.  Allergic/Immunologic: Negative for environmental allergies.  Neurological:  Negative for dizziness, syncope and headaches.  Hematological:  Negative for adenopathy. Does not bruise/bleed easily.  Psychiatric/Behavioral:  Negative for decreased concentration and dysphoric mood. The patient is not nervous/anxious.       Objective:   Physical Exam Constitutional:      General: She is not in acute distress.    Appearance: Normal appearance. She is well-developed.     Comments: overwt  HENT:     Head: Normocephalic and atraumatic.     Mouth/Throat:     Mouth: Mucous membranes are moist.  Eyes:     General: No scleral icterus.    Conjunctiva/sclera: Conjunctivae normal.     Pupils: Pupils are equal, round, and reactive to light.  Neck:     Thyroid: No thyromegaly.     Vascular: No carotid bruit or JVD.  Cardiovascular:     Rate and Rhythm: Regular rhythm. Tachycardia present.     Heart sounds: Normal heart sounds.    No gallop.  Pulmonary:     Effort: Pulmonary effort is normal. No respiratory distress.     Breath sounds: Normal breath sounds. No stridor. No wheezing, rhonchi or rales.  Abdominal:     General: There is no distension or abdominal bruit.     Palpations: Abdomen is soft.  Musculoskeletal:     Cervical back: Normal range of motion and neck supple.     Right lower leg: No edema.     Left lower leg: No edema.  Lymphadenopathy:     Cervical: No cervical adenopathy.  Skin:    General: Skin is warm and dry.     Coloration: Skin is not pale.     Findings: No rash.  Neurological:     Mental Status: She is alert.     Cranial Nerves: No cranial nerve deficit.      Coordination: Coordination normal.     Deep Tendon Reflexes: Reflexes are normal and symmetric. Reflexes normal.  Psychiatric:        Mood and Affect: Mood normal.          Assessment & Plan:   Problem List Items Addressed This Visit  Cardiovascular and Mediastinum   Essential hypertension - Primary    bp in fair control at this time  BP Readings from Last 1 Encounters:  08/19/21 140/80  will expect improvement when done with her prednisone and meloxicam  No changes needed Continue hctz 25 mg daily  Amlodipine 5 mg daily  Tolerating both  Most recent labs reviewed  Disc lifstyle change with low sodium diet and exercise  Strongly encourage to quit smoking       Relevant Medications   amLODipine (NORVASC) 5 MG tablet   hydrochlorothiazide (HYDRODIURIL) 25 MG tablet   Other Relevant Orders   Basic metabolic panel     Other   Low back pain    Pt saw orthopedics Deg change in hip /per pt LS looked ok  wat put on meloxicam and prednisone (likely making bp higher)          Relevant Medications   predniSONE (STERAPRED UNI-PAK 21 TAB) 10 MG (21) TBPK tablet   meloxicam (MOBIC) 15 MG tablet   TOBACCO ABUSE    Disc in detail risks of smoking and possible outcomes including copd, vascular/ heart disease, cancer , respiratory and sinus infections  Pt voices understanding She is not ready to quit yet

## 2021-08-19 NOTE — Assessment & Plan Note (Signed)
Disc in detail risks of smoking and possible outcomes including copd, vascular/ heart disease, cancer , respiratory and sinus infections  Pt voices understanding She is not ready to quit yet  

## 2021-08-19 NOTE — Patient Instructions (Addendum)
Labs today  Take care of yourself  Continue current medicines   Once you are done with meloxicam and prednisone your bp will come down more   Follow up in June as planned

## 2021-08-19 NOTE — Assessment & Plan Note (Signed)
Pt saw orthopedics Deg change in hip /per pt LS looked ok  wat put on meloxicam and prednisone (likely making bp higher)

## 2021-08-21 ENCOUNTER — Other Ambulatory Visit: Payer: Self-pay | Admitting: *Deleted

## 2021-08-21 MED ORDER — POTASSIUM CHLORIDE CRYS ER 20 MEQ PO TBCR: 10.0000 meq | EXTENDED_RELEASE_TABLET | Freq: Every day | ORAL | 3 refills | Status: DC

## 2021-08-21 NOTE — Telephone Encounter (Signed)
Left VM requesting pt to call the office back 

## 2021-08-21 NOTE — Telephone Encounter (Signed)
-----   Message from Abner Greenspan, MD sent at 08/19/2021  4:12 PM EDT ----- Potassium is slightly low  Please send in klor con (generic KCL) 20 meq  Take 1/2 pill daily with food #45 3 refills Re check bmet in a month please

## 2021-09-21 ENCOUNTER — Encounter: Payer: BC Managed Care – PPO | Admitting: Family Medicine

## 2021-09-21 NOTE — Progress Notes (Deleted)
   Subjective:    Patient ID: Beth Reynolds, female    DOB: February 24, 1971, 51 y.o.   MRN: [id number]  HPI Here for health maintenance exam and to review chronic medical problems     Immunization History  Administered Date(s) Administered   Influenza Split 02/17/2011, 01/17/2012   Influenza,inj,Quad PF,6+ Mos 01/29/2020   PPD Test 11/01/2014   Tdap 10/09/2010   Due for tetanus  Zoster status   Mammogram Self breast exam  Colon cancer screening   Pap 01/2020 nl with neg HPV  Smoking status   Bone health  Supplements   Mood /depression  Fluoxetine   HTN 20 mg daily   GERD Prevacid    Hctz 25 mg daily  Amlodipine 5 mg daily   Lab Results  Component Value Date   CREATININE 0.85 08/19/2021   BUN 18 08/19/2021   NA 139 08/19/2021   K 3.4 (L) 08/19/2021   CL 99 08/19/2021   CO2 29 08/19/2021    Prediabetes Lab Results  Component Value Date   HGBA1C 6.3 01/22/2020      Review of Systems     Objective:   Physical Exam        Assessment & Plan:

## 2021-10-08 ENCOUNTER — Ambulatory Visit (INDEPENDENT_AMBULATORY_CARE_PROVIDER_SITE_OTHER): Payer: BC Managed Care – PPO | Admitting: Family Medicine

## 2021-10-08 ENCOUNTER — Encounter: Payer: Self-pay | Admitting: Family Medicine

## 2021-10-08 VITALS — BP 114/66 | HR 93 | Ht 67.0 in | Wt 179.8 lb

## 2021-10-08 DIAGNOSIS — R7303 Prediabetes: Secondary | ICD-10-CM

## 2021-10-08 DIAGNOSIS — Z0000 Encounter for general adult medical examination without abnormal findings: Secondary | ICD-10-CM

## 2021-10-08 DIAGNOSIS — Z1211 Encounter for screening for malignant neoplasm of colon: Secondary | ICD-10-CM

## 2021-10-08 DIAGNOSIS — F4323 Adjustment disorder with mixed anxiety and depressed mood: Secondary | ICD-10-CM

## 2021-10-08 DIAGNOSIS — I10 Essential (primary) hypertension: Secondary | ICD-10-CM | POA: Diagnosis not present

## 2021-10-08 DIAGNOSIS — F17200 Nicotine dependence, unspecified, uncomplicated: Secondary | ICD-10-CM

## 2021-10-08 DIAGNOSIS — Z1159 Encounter for screening for other viral diseases: Secondary | ICD-10-CM | POA: Insufficient documentation

## 2021-10-08 LAB — CBC WITH DIFFERENTIAL/PLATELET
Basophils Absolute: 0.1 10*3/uL (ref 0.0–0.1)
Basophils Relative: 1.1 % (ref 0.0–3.0)
Eosinophils Absolute: 0.5 10*3/uL (ref 0.0–0.7)
Eosinophils Relative: 5 % (ref 0.0–5.0)
HCT: 39.5 % (ref 36.0–46.0)
Hemoglobin: 13 g/dL (ref 12.0–15.0)
Lymphocytes Relative: 37.5 % (ref 12.0–46.0)
Lymphs Abs: 3.5 10*3/uL (ref 0.7–4.0)
MCHC: 32.9 g/dL (ref 30.0–36.0)
MCV: 88.7 fl (ref 78.0–100.0)
Monocytes Absolute: 0.6 10*3/uL (ref 0.1–1.0)
Monocytes Relative: 6 % (ref 3.0–12.0)
Neutro Abs: 4.7 10*3/uL (ref 1.4–7.7)
Neutrophils Relative %: 50.4 % (ref 43.0–77.0)
Platelets: 364 10*3/uL (ref 150.0–400.0)
RBC: 4.46 Mil/uL (ref 3.87–5.11)
RDW: 13.5 % (ref 11.5–15.5)
WBC: 9.3 10*3/uL (ref 4.0–10.5)

## 2021-10-08 LAB — COMPREHENSIVE METABOLIC PANEL
ALT: 19 U/L (ref 0–35)
AST: 16 U/L (ref 0–37)
Albumin: 4.2 g/dL (ref 3.5–5.2)
Alkaline Phosphatase: 107 U/L (ref 39–117)
BUN: 14 mg/dL (ref 6–23)
CO2: 23 mEq/L (ref 19–32)
Calcium: 9.3 mg/dL (ref 8.4–10.5)
Chloride: 107 mEq/L (ref 96–112)
Creatinine, Ser: 0.74 mg/dL (ref 0.40–1.20)
GFR: 92.39 mL/min (ref >60.00)
Glucose, Bld: 141 mg/dL — ABNORMAL HIGH (ref 70–99)
Potassium: 3.8 mEq/L (ref 3.5–5.1)
Sodium: 139 mEq/L (ref 135–145)
Total Bilirubin: 0.5 mg/dL (ref 0.2–1.2)
Total Protein: 7 g/dL (ref 6.0–8.3)

## 2021-10-08 LAB — LDL CHOLESTEROL, DIRECT: Direct LDL: 84 mg/dL

## 2021-10-08 LAB — TSH: TSH: 1.96 u[IU]/mL (ref 0.35–5.50)

## 2021-10-08 LAB — LIPID PANEL
Cholesterol: 144 mg/dL (ref 0–200)
HDL: 37.4 mg/dL — ABNORMAL LOW (ref >39.00)
NonHDL: 106.1
Total CHOL/HDL Ratio: 4
Triglycerides: 257 mg/dL — ABNORMAL HIGH (ref 0.0–149.0)
VLDL: 51.4 mg/dL — ABNORMAL HIGH (ref 0.0–40.0)

## 2021-10-08 LAB — HEMOGLOBIN A1C: Hgb A1c MFr Bld: 6.6 % — ABNORMAL HIGH (ref 4.6–6.5)

## 2021-10-08 MED ORDER — NICOTINE 10 MG IN INHA: 1.0000 | RESPIRATORY_TRACT | 0 refills | Status: DC | PRN

## 2021-10-08 NOTE — Assessment & Plan Note (Signed)
Disc in detail risks of smoking and possible outcomes including copd, vascular/ heart disease, cancer , respiratory and sinus infections  Pt voices understanding Pt is open to idea of nicotrol inhaler when she is ready to quit /considering it  Sent to pharmacy

## 2021-10-08 NOTE — Assessment & Plan Note (Signed)
a1c ordered  disc imp of low glycemic diet and wt loss to prevent DM2  

## 2021-10-08 NOTE — Assessment & Plan Note (Signed)
Pt declines any colon cancer screening, discussed this

## 2021-10-08 NOTE — Assessment & Plan Note (Signed)
Reviewed health habits including diet and exercise and skin cancer prevention Reviewed appropriate screening tests for age  Also reviewed health mt list, fam hx and immunization status , as well as social and family history   See HPI Labs ordered  phq score of 0 Disc goal of smoking cessation  Declines shingrix and tetanus vaccines  Declines breast and colon cancer screening  Declines pap

## 2021-10-08 NOTE — Assessment & Plan Note (Signed)
Hep C test added to labs today

## 2021-10-08 NOTE — Assessment & Plan Note (Signed)
bp in fair control at this time  BP Readings from Last 1 Encounters:  10/08/21 114/66   No changes needed Most recent labs reviewed  Disc lifstyle change with low sodium diet and exercise  Plan to continue hctz 25 mg daily  Amlodipine 5 mg daily

## 2021-10-08 NOTE — Assessment & Plan Note (Signed)
Doing well with fluoxetine 20 mg daily  Having a good summer /less stress when not the school year  Uses smoking to cope when stressed

## 2021-10-08 NOTE — Progress Notes (Signed)
Subjective:    Patient ID: Beth Reynolds, female    DOB: 09/16/1970, 51 y.o.   MRN: [id number]  HPI Here for health maintenance exam and to review chronic medical problems    Wt Readings from Last 3 Encounters:  10/08/21 179 lb 12.8 oz (81.6 kg)  08/19/21 176 lb 8 oz (80.1 kg)  08/04/21 173 lb 4 oz (78.6 kg)   28.16 kg/m   Immunization History  Administered Date(s) Administered   Influenza Split 02/17/2011, 01/17/2012   Influenza,inj,Quad PF,6+ Mos 01/29/2020   PPD Test 11/01/2014   Tdap 10/09/2010  Declines shingrix  Tetanus: declines   Hep C screening : wants to do   Mammogram : declines Self breast exam: no lumps   Colon cancer screening : declines   Pap 01/2020 nl with neg HPV Declines for now   Smoking status :  has increased it to 8-9 cig per day  Breathing is ok  Not ready to quit yet  Would be interested in nicotrol inhaler  Unsure if she will ever quit   She is afraid to exercise when she is smoking    Had hypnosis to quit in the past and it did not help   HTN bp is stable today  No cp or palpitations or headaches or edema  No side effects to medicines  BP Readings from Last 3 Encounters:  10/08/21 114/66  08/19/21 140/80  08/04/21 138/80     Hctz 25 mg daily  Amlodipine 5 mg daily   Lab Results  Component Value Date   CREATININE 0.85 08/19/2021   BUN 18 08/19/2021   NA 139 08/19/2021   K 3.4 (L) 08/19/2021   CL 99 08/19/2021   CO2 29 08/19/2021   Started klor con   Mood    10/08/2021    9:03 AM 01/29/2020   11:39 AM 10/07/2017    3:40 PM  Depression screen PHQ 2/9  Decreased Interest 0 0 0  Down, Depressed, Hopeless 0 0 0  PHQ - 2 Score 0 0 0  Altered sleeping   1  Tired, decreased energy   1  Change in appetite   1  Feeling bad or failure about yourself    0  Trouble concentrating   0  Moving slowly or fidgety/restless   0  Suicidal thoughts   0  PHQ-9 Score   3   Prozac 20 mg daily  Mood has been great this  summer ! Doing better    Prediabetes Lab Results  Component Value Date   HGBA1C 6.3 01/22/2020    Patient Active Problem List   Diagnosis Date Noted   Encounter for hepatitis C screening test for low risk patient 10/08/2021   Stress reaction 08/05/2020   Screening mammogram, encounter for 01/29/2020   Encounter for routine gynecological examination 01/29/2020   Colon cancer screening 01/29/2020   Neck pain 01/29/2020   Low back pain 02/06/2018   Essential hypertension 02/06/2018   IBS (irritable bowel syndrome) 01/17/2012   Prediabetes 10/09/2010   Routine general medical examination at a health care facility 10/04/2010   MENOPAUSE, EARLY 11/03/2009   Menopausal and postmenopausal disorder 10/11/2009   Migraine 01/19/2008   TOBACCO ABUSE 07/17/2007   Adjustment disorder with mixed anxiety and depressed mood 07/17/2007   Allergic rhinitis 07/17/2007   GERD 07/17/2007   Past Medical History:  Diagnosis Date   Allergic rhinoconjunctivitis    Anxiety    Early menopause    Migraine    PMDD (  premenstrual dysphoric disorder)    TB (tuberculosis), treated    Tobacco abuse    Past Surgical History:  Procedure Laterality Date   DILATION AND CURETTAGE OF UTERUS     LESION REMOVAL     from finger   Social History   Tobacco Use   Smoking status: Every Day    Packs/day: 0.25    Types: Cigarettes    Passive exposure: Past   Smokeless tobacco: Never  Substance Use Topics   Alcohol use: No    Alcohol/week: 0.0 standard drinks of alcohol   Drug use: No   Family History  Problem Relation Age of Onset   Diabetes Father    Allergies  Allergen Reactions   Avocado     Swollen lips   Paroxetine     REACTION: sever headache, not able to sleep and weight gain   Pineapple     Swelling of lips   Venlafaxine     REACTION: HA   Current Outpatient Medications on File Prior to Visit  Medication Sig Dispense Refill   amLODipine (NORVASC) 5 MG tablet Take 1 tablet (5 mg  total) by mouth daily. 90 tablet 3   cetirizine (ZYRTEC) 10 MG tablet Take 10 mg by mouth daily as needed for allergies.     FLUoxetine (PROZAC) 20 MG tablet TAKE 1 TABLET BY MOUTH EVERY DAY 90 tablet 1   hydrochlorothiazide (HYDRODIURIL) 25 MG tablet Take 1 tablet (25 mg total) by mouth daily. 90 tablet 3   lansoprazole (PREVACID) 30 MG capsule TAKE 1 CAPSULE BY MOUTH EVERY DAY 90 capsule 1   meloxicam (MOBIC) 15 MG tablet Take 15 mg by mouth daily.     potassium chloride SA (KLOR-CON M) 20 MEQ tablet Take 0.5 tablets (10 mEq total) by mouth daily. 45 tablet 3   No current facility-administered medications on file prior to visit.     Review of Systems  Constitutional:  Negative for activity change, appetite change, fatigue, fever and unexpected weight change.  HENT:  Negative for congestion, ear pain, rhinorrhea, sinus pressure and sore throat.   Eyes:  Negative for pain, redness and visual disturbance.  Respiratory:  Negative for cough, shortness of breath and wheezing.   Cardiovascular:  Negative for chest pain and palpitations.  Gastrointestinal:  Negative for abdominal pain, blood in stool, constipation and diarrhea.  Endocrine: Negative for polydipsia and polyuria.  Genitourinary:  Negative for dysuria, frequency and urgency.  Musculoskeletal:  Positive for back pain. Negative for arthralgias and myalgias.  Skin:  Negative for pallor and rash.  Allergic/Immunologic: Negative for environmental allergies.  Neurological:  Negative for dizziness, syncope and headaches.  Hematological:  Negative for adenopathy. Does not bruise/bleed easily.  Psychiatric/Behavioral:  Negative for decreased concentration and dysphoric mood. The patient is not nervous/anxious.        Objective:   Physical Exam Constitutional:      General: She is not in acute distress.    Appearance: Normal appearance. She is well-developed and normal weight. She is not ill-appearing or diaphoretic.  HENT:     Head:  Normocephalic and atraumatic.     Right Ear: Tympanic membrane, ear canal and external ear normal.     Left Ear: Tympanic membrane, ear canal and external ear normal.     Nose: Nose normal. No congestion.     Mouth/Throat:     Mouth: Mucous membranes are moist.     Pharynx: Oropharynx is clear. No posterior oropharyngeal erythema.  Eyes:  General: No scleral icterus.    Extraocular Movements: Extraocular movements intact.     Conjunctiva/sclera: Conjunctivae normal.     Pupils: Pupils are equal, round, and reactive to light.  Neck:     Thyroid: No thyromegaly.     Vascular: No carotid bruit or JVD.  Cardiovascular:     Rate and Rhythm: Normal rate and regular rhythm.     Pulses: Normal pulses.     Heart sounds: Normal heart sounds.     No gallop.  Pulmonary:     Effort: Pulmonary effort is normal. No respiratory distress.     Breath sounds: Normal breath sounds. No wheezing.     Comments: Good air exch Chest:     Chest wall: No tenderness.  Abdominal:     General: Bowel sounds are normal. There is no distension or abdominal bruit.     Palpations: Abdomen is soft. There is no mass.     Tenderness: There is no abdominal tenderness.     Hernia: No hernia is present.  Genitourinary:    Comments: Breast exam: No mass, nodules, thickening, tenderness, bulging, retraction, inflamation, nipple discharge or skin changes noted.  No axillary or clavicular LA.     Musculoskeletal:        General: No tenderness. Normal range of motion.     Cervical back: Normal range of motion and neck supple. No rigidity. No muscular tenderness.     Right lower leg: No edema.     Left lower leg: No edema.     Comments: No kyphosis   Lymphadenopathy:     Cervical: No cervical adenopathy.  Skin:    General: Skin is warm and dry.     Coloration: Skin is not pale.     Findings: No erythema or rash.  Neurological:     Mental Status: She is alert. Mental status is at baseline.     Cranial Nerves: No  cranial nerve deficit.     Motor: No abnormal muscle tone.     Coordination: Coordination normal.     Gait: Gait normal.     Deep Tendon Reflexes: Reflexes are normal and symmetric. Reflexes normal.  Psychiatric:        Mood and Affect: Mood normal.        Cognition and Memory: Cognition and memory normal.           Assessment & Plan:   Problem List Items Addressed This Visit       Cardiovascular and Mediastinum   Essential hypertension    bp in fair control at this time  BP Readings from Last 1 Encounters:  10/08/21 114/66  No changes needed Most recent labs reviewed  Disc lifstyle change with low sodium diet and exercise  Plan to continue hctz 25 mg daily  Amlodipine 5 mg daily       Relevant Orders   TSH (Completed)   Lipid panel (Completed)   Comprehensive metabolic panel (Completed)   CBC with Differential/Platelet (Completed)     Other   Adjustment disorder with mixed anxiety and depressed mood    Doing well with fluoxetine 20 mg daily  Having a good summer /less stress when not the school year  Uses smoking to cope when stressed      Colon cancer screening    Pt declines any colon cancer screening, discussed this      Encounter for hepatitis C screening test for low risk patient    Hep C test added to  labs today      Relevant Orders   Hepatitis C Antibody   Prediabetes    a1c ordered  disc imp of low glycemic diet and wt loss to prevent DM2       Relevant Orders   Hemoglobin A1c (Completed)   Routine general medical examination at a health care facility - Primary    Reviewed health habits including diet and exercise and skin cancer prevention Reviewed appropriate screening tests for age  Also reviewed health mt list, fam hx and immunization status , as well as social and family history   See HPI Labs ordered  phq score of 0 Disc goal of smoking cessation  Declines shingrix and tetanus vaccines  Declines breast and colon cancer screening   Declines pap        TOBACCO ABUSE    Disc in detail risks of smoking and possible outcomes including copd, vascular/ heart disease, cancer , respiratory and sinus infections  Pt voices understanding Pt is open to idea of nicotrol inhaler when she is ready to quit /considering it  Sent to pharmacy

## 2021-10-08 NOTE — Patient Instructions (Addendum)
Here is a handout on the shingrix vaccine  If you are interested in the shingles vaccine series (Shingrix), call your insurance or pharmacy to check on coverage and location it must be given.  If affordable - you can schedule it here or at your pharmacy depending on coverage   Try the nicotrol inhaler to quit smoking- good luck   Labs today   Take care of yourself

## 2021-10-09 LAB — HEPATITIS C ANTIBODY: Hepatitis C Ab: NONREACTIVE

## 2022-01-04 ENCOUNTER — Encounter: Payer: Self-pay | Admitting: Family Medicine

## 2022-01-04 ENCOUNTER — Other Ambulatory Visit: Payer: Self-pay | Admitting: Family Medicine

## 2022-01-04 ENCOUNTER — Ambulatory Visit: Payer: BC Managed Care – PPO | Admitting: Family Medicine

## 2022-01-04 VITALS — BP 132/82 | HR 87 | Temp 98.1°F | Ht 67.0 in | Wt 176.0 lb

## 2022-01-04 DIAGNOSIS — R7303 Prediabetes: Secondary | ICD-10-CM | POA: Diagnosis not present

## 2022-01-04 DIAGNOSIS — F17200 Nicotine dependence, unspecified, uncomplicated: Secondary | ICD-10-CM | POA: Diagnosis not present

## 2022-01-04 DIAGNOSIS — I10 Essential (primary) hypertension: Secondary | ICD-10-CM

## 2022-01-04 LAB — POCT GLYCOSYLATED HEMOGLOBIN (HGB A1C): Hemoglobin A1C: 5.8 % — AB (ref 4.0–5.6)

## 2022-01-04 NOTE — Assessment & Plan Note (Signed)
Improved a1c down from 6.6 to 5.8  Off hctz Eating a lower glycemic diet and 3 lb down (down more on home scale) Commended Disc plan for more exercise  Consider less bread   disc imp of low glycemic diet and wt loss to prevent DM2

## 2022-01-04 NOTE — Assessment & Plan Note (Signed)
Disc in detail risks of smoking and possible outcomes including copd, vascular/ heart disease, cancer , respiratory and sinus infections  Pt voices understanding Still not ready to quit entirely but doing better  5 or less cig per day  Supplements with nicotrol inhaler (only uses this during the work day) Commended and urged her to keep working on it

## 2022-01-04 NOTE — Assessment & Plan Note (Signed)
Pt stopped her hctz and K because she disliked it  Is eating better now and glucose improved  BP: 132/82  This is stable and acceptable but need to watch for further increase  Enc to continue amlodipine 5 mg daily

## 2022-01-04 NOTE — Patient Instructions (Addendum)
Work up to 5 days per week of regular exercise with cardio and strength training   Exercising helps raise HDL (good cholesterol)  For the bad cholesterol,  Avoid red meat/ fried foods/ egg yolks/ fatty breakfast meats/ butter, cheese and high fat dairy/ and shellfish      Keep working on quitting smoking  Gradually change over to the nicotrol inhaler   Take care of yourself !   Keep working on the low glycemic diet   Follow up in 6 months

## 2022-01-04 NOTE — Progress Notes (Signed)
Subjective:    Patient ID: Beth Reynolds, female    DOB: Mar 27, 1971, 51 y.o.   MRN: [id number]  HPI Pt presents for f/u of prediabetes   Wt Readings from Last 3 Encounters:  01/04/22 176 lb (79.8 kg)  10/08/21 179 lb 12.8 oz (81.6 kg)  08/19/21 176 lb 8 oz (80.1 kg)   27.57 kg/m   Last a1c was up to 6.6  Lab Results  Component Value Date   HGBA1C 6.6 (H) 10/08/2021   Is staying away from sugar products and sweet treats  Pushing to eat more veggies and lean meats (chicken, ground [place])   Has not been exercising since school started  Now is settling in  Semester is going well now   Has a gym in her complex-interested in walking, bike -cardio  Lab Results  Component Value Date   HGBA1C 5.8 (A) 01/04/2022   Improved today  Smoking status : Now using nicotrol inhaler at work  Only smokes cig at night after work- 5 cig    HTN bp is stable today  No cp or palpitations or headaches or edema  No side effects to medicines  BP Readings from Last 3 Encounters:  01/04/22 132/82  10/08/21 114/66  08/19/21 140/80     No longer taking hctz and K Taking amlodipine 5 mg daily   Lab Results  Component Value Date   CREATININE 0.74 10/08/2021   BUN 14 10/08/2021   NA 139 10/08/2021   K 3.8 10/08/2021   CL 107 10/08/2021   CO2 23 10/08/2021     Lab Results  Component Value Date   CHOL 144 10/08/2021   HDL 37.40 (L) 10/08/2021   LDLCALC 73 10/03/2017   LDLDIRECT 84.0 10/08/2021   TRIG 257.0 (H) 10/08/2021   CHOLHDL 4 10/08/2021   Patient Active Problem List   Diagnosis Date Noted   Encounter for hepatitis C screening test for low risk patient 10/08/2021   Stress reaction 08/05/2020   Screening mammogram, encounter for 01/29/2020   Encounter for routine gynecological examination 01/29/2020   Colon cancer screening 01/29/2020   Neck pain 01/29/2020   Low back pain 02/06/2018   Essential hypertension 02/06/2018   IBS (irritable bowel syndrome)  01/17/2012   Prediabetes 10/09/2010   Routine general medical examination at a health care facility 10/04/2010   MENOPAUSE, EARLY 11/03/2009   Menopausal and postmenopausal disorder 10/11/2009   Migraine 01/19/2008   TOBACCO ABUSE 07/17/2007   Adjustment disorder with mixed anxiety and depressed mood 07/17/2007   Allergic rhinitis 07/17/2007   GERD 07/17/2007   Past Medical History:  Diagnosis Date   Allergic rhinoconjunctivitis    Anxiety    Early menopause    Migraine    PMDD (premenstrual dysphoric disorder)    TB (tuberculosis), treated    Tobacco abuse    Past Surgical History:  Procedure Laterality Date   DILATION AND CURETTAGE OF UTERUS     LESION REMOVAL     from finger   Social History   Tobacco Use   Smoking status: Every Day    Packs/day: 0.25    Types: Cigarettes    Passive exposure: Past   Smokeless tobacco: Never  Substance Use Topics   Alcohol use: No    Alcohol/week: 0.0 standard drinks of alcohol   Drug use: No   Family History  Problem Relation Age of Onset   Diabetes Father    Allergies  Allergen Reactions   Avocado     Swollen  lips   Paroxetine     REACTION: sever headache, not able to sleep and weight gain   Pineapple     Swelling of lips   Venlafaxine     REACTION: HA   Current Outpatient Medications on File Prior to Visit  Medication Sig Dispense Refill   amLODipine (NORVASC) 5 MG tablet Take 1 tablet (5 mg total) by mouth daily. 90 tablet 3   cetirizine (ZYRTEC) 10 MG tablet Take 10 mg by mouth daily as needed for allergies.     FLUoxetine (PROZAC) 20 MG tablet TAKE 1 TABLET BY MOUTH EVERY DAY 90 tablet 1   lansoprazole (PREVACID) 30 MG capsule TAKE 1 CAPSULE BY MOUTH EVERY DAY 90 capsule 1   nicotine (NICOTROL) 10 MG inhaler Inhale 1 Cartridge (1 continuous puffing total) into the lungs as needed for smoking cessation. 42 each 0   No current facility-administered medications on file prior to visit.     Review of Systems   Constitutional:  Negative for activity change, appetite change, fatigue, fever and unexpected weight change.  HENT:  Negative for congestion, ear pain, rhinorrhea, sinus pressure and sore throat.   Eyes:  Negative for pain, redness and visual disturbance.  Respiratory:  Negative for cough, shortness of breath and wheezing.   Cardiovascular:  Negative for chest pain and palpitations.  Gastrointestinal:  Negative for abdominal pain, blood in stool, constipation and diarrhea.  Endocrine: Negative for polydipsia and polyuria.  Genitourinary:  Negative for dysuria, frequency and urgency.  Musculoskeletal:  Negative for arthralgias, back pain and myalgias.  Skin:  Negative for pallor and rash.  Allergic/Immunologic: Negative for environmental allergies.  Neurological:  Negative for dizziness, syncope and headaches.  Hematological:  Negative for adenopathy. Does not bruise/bleed easily.  Psychiatric/Behavioral:  Negative for decreased concentration and dysphoric mood. The patient is not nervous/anxious.        Objective:   Physical Exam Constitutional:      General: She is not in acute distress.    Appearance: Normal appearance. She is well-developed and normal weight. She is not ill-appearing or diaphoretic.  HENT:     Head: Normocephalic and atraumatic.  Eyes:     General: No scleral icterus.    Conjunctiva/sclera: Conjunctivae normal.     Pupils: Pupils are equal, round, and reactive to light.  Neck:     Thyroid: No thyromegaly.     Vascular: No carotid bruit or JVD.  Cardiovascular:     Rate and Rhythm: Normal rate and regular rhythm.     Heart sounds: Normal heart sounds.     No gallop.  Pulmonary:     Effort: Pulmonary effort is normal. No respiratory distress.     Breath sounds: Normal breath sounds. No stridor. No wheezing, rhonchi or rales.     Comments: Bs are slt distant  Abdominal:     General: There is no distension or abdominal bruit.     Palpations: Abdomen is soft.   Musculoskeletal:     Cervical back: Normal range of motion and neck supple.     Right lower leg: No edema.     Left lower leg: No edema.  Lymphadenopathy:     Cervical: No cervical adenopathy.  Skin:    General: Skin is warm and dry.     Coloration: Skin is not pale.     Findings: No rash.  Neurological:     Mental Status: She is alert.     Coordination: Coordination normal.  Deep Tendon Reflexes: Reflexes are normal and symmetric. Reflexes normal.  Psychiatric:        Mood and Affect: Mood normal.           Assessment & Plan:   Problem List Items Addressed This Visit       Cardiovascular and Mediastinum   Essential hypertension    Pt stopped her hctz and K because she disliked it  Is eating better now and glucose improved  BP: 132/82  This is stable and acceptable but need to watch for further increase  Enc to continue amlodipine 5 mg daily          Other   Prediabetes - Primary    Improved a1c down from 6.6 to 5.8  Off hctz Eating a lower glycemic diet and 3 lb down (down more on home scale) Commended Disc plan for more exercise  Consider less bread   disc imp of low glycemic diet and wt loss to prevent DM2       Relevant Orders   POCT glycosylated hemoglobin (Hb A1C) (Completed)   TOBACCO ABUSE    Disc in detail risks of smoking and possible outcomes including copd, vascular/ heart disease, cancer , respiratory and sinus infections  Pt voices understanding Still not ready to quit entirely but doing better  5 or less cig per day  Supplements with nicotrol inhaler (only uses this during the work day) Commended and urged her to keep working on it

## 2022-06-30 ENCOUNTER — Telehealth: Payer: Self-pay | Admitting: Family Medicine

## 2022-06-30 MED ORDER — FLUOXETINE HCL 20 MG PO TABS: 30.0000 mg | ORAL_TABLET | Freq: Every day | ORAL | 3 refills | Status: DC

## 2022-06-30 NOTE — Telephone Encounter (Signed)
Refill note due until 08/15/22 (filled 01/05/22 #90 tabs/ 2 refill) but see pt's note at the bottom of the message:  Patient states tat she is taking 1 1/2 tablets so 30 instead of 20 mg, would like to get a refill or the dosage increased.

## 2022-06-30 NOTE — Telephone Encounter (Signed)
Prescription Request  06/30/2022  LOV: 01/04/2022  What is the name of the medication or equipment? FLUoxetine (PROZAC) 20 MG tablet   Have you contacted your pharmacy to request a refill? Yes refill is not due until 4/24  Which pharmacy would you like this sent to?  [id number] [address], [place] [address] [place] [address] Phone: [phone] Fax: [phone]    Patient notified that their request is being sent to the clinical staff for review and that they should receive a response within 2 business days.   Please advise at Mobile [phone]  Patient states tat she is taking 1 1/2 tablets so 30 instead of 20 mg, would like to get a refill or the dosage increased.

## 2022-09-25 ENCOUNTER — Other Ambulatory Visit: Payer: Self-pay | Admitting: Family Medicine

## 2022-10-16 ENCOUNTER — Other Ambulatory Visit: Payer: Self-pay | Admitting: Family Medicine

## 2022-10-18 NOTE — Telephone Encounter (Signed)
Pt is overdue for her CPE, please schedule (lab prior).  Med refilled once

## 2022-10-18 NOTE — Telephone Encounter (Signed)
LVMTCB and schedule

## 2022-10-18 NOTE — Telephone Encounter (Signed)
Patient scheduled.

## 2022-10-28 ENCOUNTER — Other Ambulatory Visit (INDEPENDENT_AMBULATORY_CARE_PROVIDER_SITE_OTHER): Payer: Self-pay

## 2022-10-28 ENCOUNTER — Telehealth: Payer: Self-pay | Admitting: Family Medicine

## 2022-10-28 DIAGNOSIS — I10 Essential (primary) hypertension: Secondary | ICD-10-CM

## 2022-10-28 DIAGNOSIS — Z79899 Other long term (current) drug therapy: Secondary | ICD-10-CM | POA: Diagnosis not present

## 2022-10-28 DIAGNOSIS — R7303 Prediabetes: Secondary | ICD-10-CM

## 2022-10-28 DIAGNOSIS — Z0000 Encounter for general adult medical examination without abnormal findings: Secondary | ICD-10-CM

## 2022-10-28 LAB — CBC WITH DIFFERENTIAL/PLATELET
Basophils Absolute: 0.1 10*3/uL (ref 0.0–0.1)
Basophils Relative: 1.2 % (ref 0.0–3.0)
Eosinophils Absolute: 0.3 10*3/uL (ref 0.0–0.7)
Eosinophils Relative: 3.7 % (ref 0.0–5.0)
HCT: 38.8 % (ref 36.0–46.0)
Hemoglobin: 12.7 g/dL (ref 12.0–15.0)
Lymphocytes Relative: 33.9 % (ref 12.0–46.0)
Lymphs Abs: 3 10*3/uL (ref 0.7–4.0)
MCHC: 32.6 g/dL (ref 30.0–36.0)
MCV: 87.9 fl (ref 78.0–100.0)
Monocytes Absolute: 0.8 10*3/uL (ref 0.1–1.0)
Monocytes Relative: 9.3 % (ref 3.0–12.0)
Neutro Abs: 4.7 10*3/uL (ref 1.4–7.7)
Neutrophils Relative %: 51.9 % (ref 43.0–77.0)
Platelets: 411 10*3/uL — ABNORMAL HIGH (ref 150.0–400.0)
RBC: 4.41 Mil/uL (ref 3.87–5.11)
RDW: 13.2 % (ref 11.5–15.5)
WBC: 9 10*3/uL (ref 4.0–10.5)

## 2022-10-28 LAB — COMPREHENSIVE METABOLIC PANEL
ALT: 14 U/L (ref 0–35)
AST: 15 U/L (ref 0–37)
Albumin: 4.1 g/dL (ref 3.5–5.2)
Alkaline Phosphatase: 129 U/L — ABNORMAL HIGH (ref 39–117)
BUN: 10 mg/dL (ref 6–23)
CO2: 27 mEq/L (ref 19–32)
Calcium: 9.5 mg/dL (ref 8.4–10.5)
Chloride: 104 mEq/L (ref 96–112)
Creatinine, Ser: 0.65 mg/dL (ref 0.40–1.20)
GFR: 99.8 mL/min (ref >60.00)
Glucose, Bld: 87 mg/dL (ref 70–99)
Potassium: 4 mEq/L (ref 3.5–5.1)
Sodium: 138 mEq/L (ref 135–145)
Total Bilirubin: 0.4 mg/dL (ref 0.2–1.2)
Total Protein: 6.9 g/dL (ref 6.0–8.3)

## 2022-10-28 LAB — LIPID PANEL
Cholesterol: 149 mg/dL (ref 0–200)
HDL: 34.3 mg/dL — ABNORMAL LOW (ref >39.00)
NonHDL: 115.15
Total CHOL/HDL Ratio: 4
Triglycerides: 237 mg/dL — ABNORMAL HIGH (ref 0.0–149.0)
VLDL: 47.4 mg/dL — ABNORMAL HIGH (ref 0.0–40.0)

## 2022-10-28 LAB — LDL CHOLESTEROL, DIRECT: Direct LDL: 96 mg/dL

## 2022-10-28 LAB — TSH: TSH: 1.51 u[IU]/mL (ref 0.35–5.50)

## 2022-10-28 LAB — VITAMIN D 25 HYDROXY (VIT D DEFICIENCY, FRACTURES): VITD: 25.28 ng/mL — ABNORMAL LOW (ref 30.00–100.00)

## 2022-10-28 LAB — VITAMIN B12: Vitamin B-12: 262 pg/mL (ref 211–911)

## 2022-10-28 LAB — HEMOGLOBIN A1C: Hgb A1c MFr Bld: 6.2 % (ref 4.6–6.5)

## 2022-10-28 NOTE — Telephone Encounter (Signed)
Lab orders

## 2022-11-04 ENCOUNTER — Encounter: Payer: Self-pay | Admitting: Family Medicine

## 2022-11-04 ENCOUNTER — Ambulatory Visit (INDEPENDENT_AMBULATORY_CARE_PROVIDER_SITE_OTHER): Payer: Self-pay | Admitting: Family Medicine

## 2022-11-04 VITALS — BP 121/80 | HR 89 | Temp 98.3°F | Ht 67.0 in | Wt 178.2 lb

## 2022-11-04 DIAGNOSIS — F17200 Nicotine dependence, unspecified, uncomplicated: Secondary | ICD-10-CM

## 2022-11-04 DIAGNOSIS — Z1211 Encounter for screening for malignant neoplasm of colon: Secondary | ICD-10-CM

## 2022-11-04 DIAGNOSIS — Z79899 Other long term (current) drug therapy: Secondary | ICD-10-CM

## 2022-11-04 DIAGNOSIS — R7303 Prediabetes: Secondary | ICD-10-CM

## 2022-11-04 DIAGNOSIS — I10 Essential (primary) hypertension: Secondary | ICD-10-CM

## 2022-11-04 DIAGNOSIS — K219 Gastro-esophageal reflux disease without esophagitis: Secondary | ICD-10-CM

## 2022-11-04 DIAGNOSIS — Z0000 Encounter for general adult medical examination without abnormal findings: Secondary | ICD-10-CM

## 2022-11-04 DIAGNOSIS — F4323 Adjustment disorder with mixed anxiety and depressed mood: Secondary | ICD-10-CM

## 2022-11-04 DIAGNOSIS — E559 Vitamin D deficiency, unspecified: Secondary | ICD-10-CM

## 2022-11-04 NOTE — Assessment & Plan Note (Signed)
Continues ppi/prevacid  Has not been able to stop this   Encouraged to watch diet Encouraged to stop smoking  Will watch vitamin levels and renal fxn

## 2022-11-04 NOTE — Progress Notes (Signed)
Subjective:    Patient ID: Beth Reynolds, female    DOB: [birth year], 52 y.o.   MRN: [id number]  HPI  Here for health maintenance exam and to review chronic medical problems   Wt Readings from Last 3 Encounters:  11/04/22 178 lb 3.2 oz (80.8 kg)  01/04/22 176 lb (79.8 kg)  10/08/21 179 lb 12.8 oz (81.6 kg)   27.91 kg/m  Vitals:   11/04/22 0918 11/04/22 1003  BP: (!) 140/82 121/80  Pulse: 89   Temp: 98.3 F (36.8 C)   SpO2: 97%     Immunization History  Administered Date(s) Administered   Influenza Split 02/17/2011, 01/17/2012   Influenza,inj,Quad PF,6+ Mos 01/29/2020   PPD Test 11/01/2014   Tdap 10/09/2010    Health Maintenance Due  Topic Date Due   COVID-19 Vaccine (1) Never done   MAMMOGRAM  Never done   Zoster Vaccines- Shingrix (1 of 2) Never done   Colonoscopy  Never done   DTaP/Tdap/Td (2 - Td or Tdap) 10/08/2020   INFLUENZA VACCINE  10/28/2022   Feeling good overall  Busy Sold house and moving to [place] later this month  Got a job at [employer]   Her husband already works on [employer] and commutes    Shingrix- declines   Tetanus shot  - declines   Flu shots - declines   Mammogram- declines  Declines breast exam Self breast exam-no lumps or changes   Gyn health-no gyn provider  Pap was 01/2020 normal with neg HPV  No new partner No gyn symptoms No history of abn paps  Went through menopause early    Smoking status : more in the summer because she home in the summer 1/2 ppd  Wants to quit but not ready  Has used nicotine inhaler but not patch  Not ready to start the patch   No breathing problems   Has smoked since age 36     Colon cancer screening -would consider cologuard when she gets insurance   Bone health  Had early menopause  Dexa -never Falls-none  Fractures-none  Supplements  Last vitamin D Lab Results  Component Value Date   VD25OH 25.28 (L) 10/28/2022   Exercise :  Walking , stairs  Weights - at  home - 8 lb      Mood    11/04/2022    9:20 AM 10/08/2021    9:03 AM 01/29/2020   11:39 AM 10/07/2017    3:40 PM  Depression screen PHQ 2/9  Decreased Interest 0 0 0 0  Down, Depressed, Hopeless 0 0 0 0  PHQ - 2 Score 0 0 0 0  Altered sleeping 0   1  Tired, decreased energy 2   1  Change in appetite 0   1  Feeling bad or failure about yourself  0   0  Trouble concentrating 0   0  Moving slowly or fidgety/restless 0   0  Suicidal thoughts 0   0  PHQ-9 Score 2   3  Difficult doing work/chores Not difficult at all      Anxiety / depression  Taking fluoxetine 30 mg daily Excited about upcoming move    HTN bp is stable today  No cp or palpitations or headaches or edema  No side effects to medicines  BP Readings from Last 3 Encounters:  11/04/22 121/80  01/04/22 132/82  10/08/21 114/66    Amlodipine 5 mg daily  Has not checked at home this summer  Lab Results  Component Value Date   NA 138 10/28/2022   K 4.0 10/28/2022   CO2 27 10/28/2022   GLUCOSE 87 10/28/2022   BUN 10 10/28/2022   CREATININE 0.65 10/28/2022   CALCIUM 9.5 10/28/2022   GFR 99.80 10/28/2022   Lab Results  Component Value Date   ALT 14 10/28/2022   AST 15 10/28/2022   ALKPHOS 129 (H) 10/28/2022   BILITOT 0.4 10/28/2022   Lab Results  Component Value Date   WBC 9.0 10/28/2022   HGB 12.7 10/28/2022   HCT 38.8 10/28/2022   MCV 87.9 10/28/2022   PLT 411.0 (H) 10/28/2022     Disliked hydrochlorothiazide in the past  Make her K low   GERD Takes lansoprazole 30 mg daily   Lab Results  Component Value Date   VITAMINB12 262 10/28/2022  Takes mvi   Prediabetes Lab Results  Component Value Date   HGBA1C 6.2 10/28/2022   Eats a lot of sweets  Needs to change    Cholesterol Lab Results  Component Value Date   CHOL 149 10/28/2022   CHOL 144 10/08/2021   CHOL 158 01/22/2020   Lab Results  Component Value Date   HDL 34.30 (L) 10/28/2022   HDL 37.40 (L) 10/08/2021   HDL 33.80  (L) 01/22/2020   Lab Results  Component Value Date   LDLCALC 73 10/03/2017   LDLCALC 71 10/05/2010   Lab Results  Component Value Date   TRIG 237.0 (H) 10/28/2022   TRIG 257.0 (H) 10/08/2021   TRIG 232.0 (H) 01/22/2020   Lab Results  Component Value Date   CHOLHDL 4 10/28/2022   CHOLHDL 4 10/08/2021   CHOLHDL 5 01/22/2020   Lab Results  Component Value Date   LDLDIRECT 96.0 10/28/2022   LDLDIRECT 84.0 10/08/2021   LDLDIRECT 105.0 01/22/2020   Trig are high  Good LDL  Low HDL    Lab Results  Component Value Date   WBC 9.0 10/28/2022   HGB 12.7 10/28/2022   HCT 38.8 10/28/2022   MCV 87.9 10/28/2022   PLT 411.0 (H) 10/28/2022       Patient Active Problem List   Diagnosis Date Noted   Vitamin D deficiency 11/04/2022   Current use of proton pump inhibitor 10/28/2022   Encounter for hepatitis C screening test for low risk patient 10/08/2021   Stress reaction 08/05/2020   Screening mammogram, encounter for 01/29/2020   Encounter for routine gynecological examination 01/29/2020   Colon cancer screening 01/29/2020   Neck pain 01/29/2020   Low back pain 02/06/2018   Essential hypertension 02/06/2018   IBS (irritable bowel syndrome) 01/17/2012   Prediabetes 10/09/2010   Routine general medical examination at a health care facility 10/04/2010   MENOPAUSE, EARLY 11/03/2009   Menopausal and postmenopausal disorder 10/11/2009   Migraine 01/19/2008   TOBACCO ABUSE 07/17/2007   Adjustment disorder with mixed anxiety and depressed mood 07/17/2007   Allergic rhinitis 07/17/2007   GERD 07/17/2007   Past Medical History:  Diagnosis Date   Allergic rhinoconjunctivitis    Anxiety    Early menopause    Migraine    PMDD (premenstrual dysphoric disorder)    TB (tuberculosis), treated    Tobacco abuse    Past Surgical History:  Procedure Laterality Date   DILATION AND CURETTAGE OF UTERUS     LESION REMOVAL     from finger   Social History   Tobacco Use    Smoking status: Every Day    Current  packs/day: 0.25    Types: Cigarettes    Passive exposure: Past   Smokeless tobacco: Never  Substance Use Topics   Alcohol use: No    Alcohol/week: 0.0 standard drinks of alcohol   Drug use: No   Family History  Problem Relation Age of Onset   Diabetes Father    Allergies  Allergen Reactions   Avocado     Swollen lips   Paroxetine     REACTION: sever headache, not able to sleep and weight gain   Pineapple     Swelling of lips   Venlafaxine     REACTION: HA   Current Outpatient Medications on File Prior to Visit  Medication Sig Dispense Refill   amLODipine (NORVASC) 5 MG tablet TAKE 1 TABLET (5 MG TOTAL) BY MOUTH DAILY. 90 tablet 0   cetirizine (ZYRTEC) 10 MG tablet Take 10 mg by mouth daily as needed for allergies.     FLUoxetine (PROZAC) 20 MG tablet Take 1.5 tablets (30 mg total) by mouth daily. 135 tablet 3   lansoprazole (PREVACID) 30 MG capsule TAKE 1 CAPSULE BY MOUTH EVERY DAY 90 capsule 0   nicotine (NICOTROL) 10 MG inhaler Inhale 1 Cartridge (1 continuous puffing total) into the lungs as needed for smoking cessation. 42 each 0   No current facility-administered medications on file prior to visit.    Review of Systems  Constitutional:  Negative for activity change, appetite change, fatigue, fever and unexpected weight change.  HENT:  Negative for congestion, ear pain, rhinorrhea, sinus pressure and sore throat.   Eyes:  Negative for pain, redness and visual disturbance.  Respiratory:  Negative for cough, shortness of breath and wheezing.   Cardiovascular:  Negative for chest pain and palpitations.  Gastrointestinal:  Negative for abdominal pain, blood in stool, constipation and diarrhea.  Endocrine: Negative for polydipsia and polyuria.  Genitourinary:  Negative for dysuria, frequency and urgency.  Musculoskeletal:  Positive for arthralgias. Negative for back pain and myalgias.  Skin:  Negative for pallor and rash.   Allergic/Immunologic: Negative for environmental allergies.  Neurological:  Negative for dizziness, syncope and headaches.  Hematological:  Negative for adenopathy. Does not bruise/bleed easily.  Psychiatric/Behavioral:  Negative for decreased concentration and dysphoric mood. The patient is nervous/anxious.        Objective:   Physical Exam Constitutional:      General: She is not in acute distress.    Appearance: Normal appearance. She is well-developed and normal weight. She is not ill-appearing or diaphoretic.  HENT:     Head: Normocephalic and atraumatic.     Right Ear: Tympanic membrane, ear canal and external ear normal.     Left Ear: Tympanic membrane, ear canal and external ear normal.     Nose: Nose normal. No congestion.     Mouth/Throat:     Mouth: Mucous membranes are moist.     Pharynx: Oropharynx is clear. No posterior oropharyngeal erythema.  Eyes:     General: No scleral icterus.    Extraocular Movements: Extraocular movements intact.     Conjunctiva/sclera: Conjunctivae normal.     Pupils: Pupils are equal, round, and reactive to light.  Neck:     Thyroid: No thyromegaly.     Vascular: No carotid bruit or JVD.  Cardiovascular:     Rate and Rhythm: Normal rate and regular rhythm.     Pulses: Normal pulses.     Heart sounds: Normal heart sounds.     No gallop.  Pulmonary:  Effort: Pulmonary effort is normal. No respiratory distress.     Breath sounds: Normal breath sounds. No wheezing.     Comments: Diffusely distant bs  Chest:     Chest wall: No tenderness.  Abdominal:     General: Bowel sounds are normal. There is no distension or abdominal bruit.     Palpations: Abdomen is soft. There is no mass.     Tenderness: There is no abdominal tenderness.     Hernia: No hernia is present.  Genitourinary:    Comments: Declines breast exam  Musculoskeletal:        General: No tenderness. Normal range of motion.     Cervical back: Normal range of motion  and neck supple. No rigidity. No muscular tenderness.     Right lower leg: No edema.     Left lower leg: No edema.     Comments: No kyphosis   Lymphadenopathy:     Cervical: No cervical adenopathy.  Skin:    General: Skin is warm and dry.     Coloration: Skin is not pale.     Findings: No erythema or rash.  Neurological:     Mental Status: She is alert. Mental status is at baseline.     Cranial Nerves: No cranial nerve deficit.     Motor: No abnormal muscle tone.     Coordination: Coordination normal.     Gait: Gait normal.     Deep Tendon Reflexes: Reflexes are normal and symmetric. Reflexes normal.  Psychiatric:        Mood and Affect: Mood normal.        Cognition and Memory: Cognition and memory normal.           Assessment & Plan:   Problem List Items Addressed This Visit       Cardiovascular and Mediastinum   Essential hypertension    bp in fair control at this time  BP Readings from Last 1 Encounters:  11/04/22 121/80   No changes needed Most recent labs reviewed  Disc lifstyle change with low sodium diet and exercise  Continues amlodipine 5 mg daily          Digestive   GERD    Continues ppi/prevacid  Has not been able to stop this   Encouraged to watch diet Encouraged to stop smoking  Will watch vitamin levels and renal fxn        Other   Vitamin D deficiency    Last vitamin D Lab Results  Component Value Date   VD25OH 25.28 (L) 10/28/2022   Encouraged her to take 2000 international units D3 daily in addition to her mvi   Discussed importance for bone and overall health       TOBACCO ABUSE    Disc in detail risks of smoking and possible outcomes including copd, vascular/ heart disease, cancer , respiratory and sinus infections  Pt voices understanding' She is not ready to quit  Has used nicotine inhaler in past  Declines patch because she feels need to smoke at times       Routine general medical examination at a health care  facility - Primary    Reviewed health habits including diet and exercise and skin cancer prevention Reviewed appropriate screening tests for age  Also reviewed health mt list, fam hx and immunization status , as well as social and family history   See HPI Labs reviewed and ordered Declines all imms Aware she needs Td prn if injured Ronny Bacon  Declines any breast cancer screen / mammor oexam Pap utd 01/2020  Counseled on smoking cessation  Declines colonoscopy but interested in cologuard when she gets insurance Discussed bone health / strongly encouraged dexa in light of smoking and early menopause (she declines for now but may consider later)  Counseled on vit D intake and exercise PHQ 2    treated       Prediabetes    Lab Results  Component Value Date   HGBA1C 6.2 10/28/2022   Up a bit disc imp of low glycemic diet and wt loss to prevent DM2  Pt struggles with sugar cravings       Current use of proton pump inhibitor    Lab Results  Component Value Date   VITAMINB12 262 10/28/2022   Last vitamin D Lab Results  Component Value Date   VD25OH 25.28 (L) 10/28/2022    Will supplement       Colon cancer screening    Declines colonoscopy  Would consider cologuard when she gets insurance with new job       Adjustment disorder with mixed anxiety and depressed mood    PHQ 2 Moving -in process/some stress Fluoxetine 30 mg daily is helpful   Reviewed stressors/ coping techniques/symptoms/ support sources/ tx options and side effects in detail today

## 2022-11-04 NOTE — Assessment & Plan Note (Signed)
Last vitamin D Lab Results  Component Value Date   VD25OH 25.28 (L) 10/28/2022   Encouraged her to take 2000 international units D3 daily in addition to her mvi   Discussed importance for bone and overall health

## 2022-11-04 NOTE — Assessment & Plan Note (Signed)
Lab Results  Component Value Date   VITAMINB12 262 10/28/2022   Last vitamin D Lab Results  Component Value Date   VD25OH 25.28 (L) 10/28/2022    Will supplement

## 2022-11-04 NOTE — Assessment & Plan Note (Signed)
Lab Results  Component Value Date   HGBA1C 6.2 10/28/2022   Up a bit disc imp of low glycemic diet and wt loss to prevent DM2  Pt struggles with sugar cravings

## 2022-11-04 NOTE — Assessment & Plan Note (Signed)
Disc in detail risks of smoking and possible outcomes including copd, vascular/ heart disease, cancer , respiratory and sinus infections  Pt voices understanding' She is not ready to quit  Has used nicotine inhaler in past  Declines patch because she feels need to smoke at times

## 2022-11-04 NOTE — Patient Instructions (Addendum)
If you get a wound- you need to update your tetanus shot   In the future you would be a good candidate for a bone density test   For bone health  Get more vitamin D  In addition to your multi vitamin- take another 2000 international units of vitamin D3 every day   For exercise  Add some strength training to your routine, this is important for bone and brain health and can reduce your risk of falls and help your body use insulin properly and regulate weight  Light weights, exercise bands , and internet videos are a good way to start  Yoga (chair or regular), machines , floor exercises or a gym with machines are also good options    To prevent diabetes Try to get most of your carbohydrates from produce (with the exception of white potatoes) and whole grains Eat less bread/pasta/rice/snack foods/cereals/sweets and other items from the middle of the grocery store (processed carbs)  If you are interested in lung cancer screening (CT scan)   Consider fish oil or other omega 3 supplement for cholesterol

## 2022-11-04 NOTE — Assessment & Plan Note (Signed)
Declines colonoscopy  Would consider cologuard when she gets insurance with new job

## 2022-11-04 NOTE — Assessment & Plan Note (Signed)
PHQ 2 Moving -in process/some stress Fluoxetine 30 mg daily is helpful   Reviewed stressors/ coping techniques/symptoms/ support sources/ tx options and side effects in detail today

## 2022-11-04 NOTE — Assessment & Plan Note (Signed)
bp in fair control at this time  BP Readings from Last 1 Encounters:  11/04/22 121/80   No changes needed Most recent labs reviewed  Disc lifstyle change with low sodium diet and exercise  Continues amlodipine 5 mg daily

## 2022-11-04 NOTE — Assessment & Plan Note (Signed)
Reviewed health habits including diet and exercise and skin cancer prevention Reviewed appropriate screening tests for age  Also reviewed health mt list, fam hx and immunization status , as well as social and family history   See HPI Labs reviewed and ordered Declines all imms Aware she needs Td prn if injured /wound  Declines any breast cancer screen / mammor oexam Pap utd 01/2020  Counseled on smoking cessation  Declines colonoscopy but interested in cologuard when she gets insurance Discussed bone health / strongly encouraged dexa in light of smoking and early menopause (she declines for now but may consider later)  Counseled on vit D intake and exercise PHQ 2    treated

## 2022-12-29 ENCOUNTER — Other Ambulatory Visit: Payer: Self-pay | Admitting: Family Medicine

## 2023-01-16 ENCOUNTER — Other Ambulatory Visit: Payer: Self-pay | Admitting: Family Medicine

## 2023-03-27 ENCOUNTER — Other Ambulatory Visit: Payer: Self-pay | Admitting: Family Medicine

## 2023-07-20 ENCOUNTER — Ambulatory Visit: Payer: Self-pay | Admitting: Family Medicine

## 2023-07-20 ENCOUNTER — Encounter: Payer: Self-pay | Admitting: Family Medicine

## 2023-07-20 VITALS — BP 136/84 | HR 90 | Temp 98.5°F | Ht 67.0 in | Wt 185.1 lb

## 2023-07-20 DIAGNOSIS — Z79899 Other long term (current) drug therapy: Secondary | ICD-10-CM | POA: Diagnosis not present

## 2023-07-20 DIAGNOSIS — K219 Gastro-esophageal reflux disease without esophagitis: Secondary | ICD-10-CM

## 2023-07-20 DIAGNOSIS — Z1211 Encounter for screening for malignant neoplasm of colon: Secondary | ICD-10-CM | POA: Diagnosis not present

## 2023-07-20 DIAGNOSIS — F17200 Nicotine dependence, unspecified, uncomplicated: Secondary | ICD-10-CM | POA: Diagnosis not present

## 2023-07-20 MED ORDER — SACCHAROMYCES BOULARDII 250 MG PO CAPS: 250.0000 mg | ORAL_CAPSULE | Freq: Two times a day (BID) | ORAL | 0 refills | Status: DC

## 2023-07-20 MED ORDER — FAMOTIDINE 20 MG PO TABS: 20.0000 mg | ORAL_TABLET | Freq: Two times a day (BID) | ORAL | 2 refills | Status: DC

## 2023-07-20 NOTE — Progress Notes (Signed)
 Subjective:    Patient ID: Beth Reynolds, female    DOB: 1970-05-07, 53 y.o.   MRN: [id number]  HPI  Wt Readings from Last 3 Encounters:  07/20/23 185 lb 2 oz (84 kg)  11/04/22 178 lb 3.2 oz (80.8 kg)  01/04/22 176 lb (79.8 kg)   28.99 kg/m  Vitals:   07/20/23 1017  BP: 136/84  Pulse: 90  Temp: 98.5 F (36.9 C)  SpO2: 100%   Pt presents with GI symptoms  Acid reflux  Gas   Was in [place]  Returned and then got the noro virus  Really sick/ missed 2 weeks of worse  Nausea and vomiting /diarrhea - was tested positive for noro   No nsaids for a while    Following that -cannot get heartburn and sour burps (acid in throat and mouth) under control  The lansoprazole  is no longer controlling it  Using tums Also gaviscon   Tried change to lactose free milk  Non dairy yogurt  No difference yet  No diarrhea or constipation  No blood or black stool  No abdominal pain    Takes probiotics  Metamucil gummies    Has not had colonoscopy for screening  Declined in the past May be open to in the future      History of GERD Takes lansoprazole  30 mg daily   Smoking status 1ppd Not ready to quit     Lab Results  Component Value Date   NA 138 10/28/2022   K 4.0 10/28/2022   CO2 27 10/28/2022   GLUCOSE 87 10/28/2022   BUN 10 10/28/2022   CREATININE 0.65 10/28/2022   CALCIUM 9.5 10/28/2022   GFR 99.80 10/28/2022   Lab Results  Component Value Date   ALT 14 10/28/2022   AST 15 10/28/2022   ALKPHOS 129 (H) 10/28/2022   BILITOT 0.4 10/28/2022   Lab Results  Component Value Date   WBC 9.0 10/28/2022   HGB 12.7 10/28/2022   HCT 38.8 10/28/2022   MCV 87.9 10/28/2022   PLT 411.0 (H) 10/28/2022   Lab Results  Component Value Date   VITAMINB12 262 10/28/2022      Patient Active Problem List   Diagnosis Date Noted   Vitamin D  deficiency 11/04/2022   Current use of proton pump inhibitor 10/28/2022   Encounter for hepatitis C screening  test for low risk patient 10/08/2021   Stress reaction 08/05/2020   Screening mammogram, encounter for 01/29/2020   Encounter for routine gynecological examination 01/29/2020   Colon cancer screening 01/29/2020   Neck pain 01/29/2020   Low back pain 02/06/2018   Essential hypertension 02/06/2018   IBS (irritable bowel syndrome) 01/17/2012   Prediabetes 10/09/2010   Routine general medical examination at a health care facility 10/04/2010   MENOPAUSE, EARLY 11/03/2009   Menopausal and postmenopausal disorder 10/11/2009   Migraine 01/19/2008   TOBACCO ABUSE 07/17/2007   Adjustment disorder with mixed anxiety and depressed mood 07/17/2007   Allergic rhinitis 07/17/2007   GERD 07/17/2007   Past Medical History:  Diagnosis Date   Allergic rhinoconjunctivitis    Anxiety    Early menopause    Migraine    PMDD (premenstrual dysphoric disorder)    TB (tuberculosis), treated    Tobacco abuse    Past Surgical History:  Procedure Laterality Date   DILATION AND CURETTAGE OF UTERUS     LESION REMOVAL     from finger   Social History   Tobacco Use   Smoking  status: Every Day    Current packs/day: 0.25    Types: Cigarettes    Passive exposure: Past   Smokeless tobacco: Never  Substance Use Topics   Alcohol use: No    Alcohol/week: 0.0 standard drinks of alcohol   Drug use: No   Family History  Problem Relation Age of Onset   Diabetes Father    Allergies  Allergen Reactions   Avocado     Swollen lips   Paroxetine     REACTION: sever headache, not able to sleep and weight gain   Pineapple     Swelling of lips   Venlafaxine     REACTION: HA   Current Outpatient Medications on File Prior to Visit  Medication Sig Dispense Refill   amLODipine  (NORVASC ) 5 MG tablet TAKE 1 TABLET (5 MG TOTAL) BY MOUTH DAILY. 90 tablet 1   cetirizine (ZYRTEC) 10 MG tablet Take 10 mg by mouth daily as needed for allergies.     FLUoxetine  (PROZAC ) 20 MG tablet Take 1.5 tablets (30 mg total) by  mouth daily. 135 tablet 3   lansoprazole  (PREVACID ) 30 MG capsule TAKE 1 CAPSULE BY MOUTH EVERY DAY 90 capsule 2   No current facility-administered medications on file prior to visit.    Review of Systems  Constitutional:  Negative for activity change, appetite change, fatigue, fever and unexpected weight change.  HENT:  Negative for congestion, ear pain, rhinorrhea, sinus pressure, sore throat and trouble swallowing.   Eyes:  Negative for pain, redness and visual disturbance.  Respiratory:  Negative for cough, shortness of breath and wheezing.   Cardiovascular:  Negative for chest pain and palpitations.  Gastrointestinal:  Positive for constipation. Negative for abdominal distention, abdominal pain, anal bleeding, blood in stool, diarrhea, nausea and vomiting.       Heartburn Regurgitation  Belching   Endocrine: Negative for polydipsia and polyuria.  Genitourinary:  Negative for dysuria, frequency and urgency.  Musculoskeletal:  Negative for arthralgias, back pain and myalgias.  Skin:  Negative for pallor and rash.  Allergic/Immunologic: Negative for environmental allergies.  Neurological:  Negative for dizziness, syncope and headaches.  Hematological:  Negative for adenopathy. Does not bruise/bleed easily.  Psychiatric/Behavioral:  Negative for decreased concentration and dysphoric mood. The patient is not nervous/anxious.        Objective:   Physical Exam Constitutional:      General: She is not in acute distress.    Appearance: Normal appearance. She is well-developed. She is not ill-appearing or diaphoretic.     Comments: Overwt   Central obesity   HENT:     Head: Normocephalic and atraumatic.     Mouth/Throat:     Mouth: Mucous membranes are moist.     Pharynx: Oropharynx is clear. No posterior oropharyngeal erythema.  Eyes:     General: No scleral icterus.    Conjunctiva/sclera: Conjunctivae normal.     Pupils: Pupils are equal, round, and reactive to light.   Cardiovascular:     Rate and Rhythm: Normal rate and regular rhythm.     Heart sounds: Normal heart sounds.  Pulmonary:     Effort: Pulmonary effort is normal. No respiratory distress.     Breath sounds: Normal breath sounds. No wheezing or rales.     Comments: Diffusely distant bs  Abdominal:     General: Bowel sounds are normal. There is no distension.     Palpations: Abdomen is soft. There is no mass.     Tenderness: There is no  abdominal tenderness. There is no guarding or rebound.     Hernia: No hernia is present.  Musculoskeletal:     Cervical back: Normal range of motion and neck supple.  Lymphadenopathy:     Cervical: No cervical adenopathy.  Skin:    General: Skin is warm and dry.     Coloration: Skin is not pale.     Findings: No erythema.  Neurological:     Mental Status: She is alert.           Assessment & Plan:   Problem List Items Addressed This Visit       Digestive   GERD - Primary   Worsened after travel and then noro virus bout  Now heartburn/ regurgitation and burping   Encouraged to avoid triggers including caffeine Consider smoking cessation and weight loss  Change lansoprazole  30 mg to am at least 30 min before food/drink/meds Adding famotidine  20 mg bid in mid am and bedtime   Florastart for 2 wk for post noro virus   Follow up here 4-6 wk  Referral to GI  If not improved may need testing for H pylori and or egd         Relevant Medications   saccharomyces boulardii (FLORASTART) 250 MG capsule   famotidine  (PEPCID ) 20 MG tablet   Other Relevant Orders   Ambulatory referral to Gastroenterology     Other   TOBACCO ABUSE   Disc in detail risks of smoking and possible outcomes including copd, vascular/ heart disease, cancer , respiratory and sinus infections as well as osteoporosis  Pt voices understanding   About 1ppd Not ready to quit        Current use of proton pump inhibitor   Lansoprazole  30 mg  Not working as well  recently  Encouraged to change dosing to am 30 min before food/drink/meds       Colon cancer screening   Pt has not had screening colonoscopy  May be interested Ref to GI to discuss (in setting of GERD/ may also need EGD)      Relevant Orders   Ambulatory referral to Gastroenterology

## 2023-07-20 NOTE — Assessment & Plan Note (Signed)
 Lansoprazole  30 mg  Not working as well recently  Encouraged to change dosing to am 30 min before food/drink/meds

## 2023-07-20 NOTE — Patient Instructions (Addendum)
 Change your dosing of lansoprazole  to morning at least 30 minutes before food/drink / coffee   Avoid triggers for reflux   Add generic pepcid  (famotidine ) 20 mg twice daily   mid morning and bedtime   Take florastar (probiotic) twice daily for 2 weeks   Follow up with me in 4-6 weeks for acid reflux   Keep thinking about quitting smoking     I will place a referral to GI  [facility]  [phone]  If symptoms worsen before you follow up please let us  know

## 2023-07-20 NOTE — Assessment & Plan Note (Signed)
 Pt has not had screening colonoscopy  May be interested Ref to GI to discuss (in setting of GERD/ may also need EGD)

## 2023-07-20 NOTE — Assessment & Plan Note (Signed)
 Worsened after travel and then noro virus bout  Now heartburn/ regurgitation and burping   Encouraged to avoid triggers including caffeine Consider smoking cessation and weight loss  Change lansoprazole  30 mg to am at least 30 min before food/drink/meds Adding famotidine  20 mg bid in mid am and bedtime   Florastart for 2 wk for post noro virus   Follow up here 4-6 wk  Referral to GI  If not improved may need testing for H pylori and or egd

## 2023-07-20 NOTE — Assessment & Plan Note (Signed)
 Disc in detail risks of smoking and possible outcomes including copd, vascular/ heart disease, cancer , respiratory and sinus infections as well as osteoporosis  Pt voices understanding   About 1ppd Not ready to quit

## 2023-09-23 ENCOUNTER — Other Ambulatory Visit: Payer: Self-pay | Admitting: Family Medicine

## 2023-09-27 NOTE — Telephone Encounter (Signed)
 She takes a total of 30 mg daily  We can prescription the caps and have her take 3 a day to get that same dose  Please let her know and if it is ok with her send back and I will prescribe

## 2023-09-27 NOTE — Telephone Encounter (Signed)
 Pharmacy put a note on med saying tabs are non formulary,  ? If okay to switch pt to caps only issue is pt is taking 1/5 tabs daily and caps said they can't be cut in half, Please advise

## 2023-09-28 NOTE — Telephone Encounter (Signed)
 Spoke to pt and she doesn't want to change to Caps she wants to keep taking what she's taking and just pay out of pocket. I did advise pt we sent a refill in on 09/23/23 and that she just needs to tell the pharmacy she wants to keep taking that and to have them go ahead and refill her tabs.

## 2023-10-22 ENCOUNTER — Other Ambulatory Visit: Payer: Self-pay | Admitting: Family Medicine

## 2023-10-24 NOTE — Telephone Encounter (Signed)
 Pt overdue for acid reflux f/u with Dr. Randeen. Please call patient to schedule.  30 day supply sent in.

## 2023-10-24 NOTE — Telephone Encounter (Signed)
 Spoke to pt, pt stated earlier this month, she requested a refill from pharmacy & was told Dr. Randeen sent a 3 month supply. Pt stated if another rx has been sent in, she wouldn't need another rx until Sept. Pt requested to sch f/u then, sch f/u for 12/14/23

## 2023-11-20 ENCOUNTER — Other Ambulatory Visit: Payer: Self-pay | Admitting: Family Medicine

## 2023-11-24 ENCOUNTER — Other Ambulatory Visit: Payer: Self-pay | Admitting: Family Medicine

## 2023-12-14 ENCOUNTER — Encounter: Payer: Self-pay | Admitting: Family Medicine

## 2023-12-14 ENCOUNTER — Ambulatory Visit: Admitting: Family Medicine

## 2023-12-14 VITALS — BP 126/72 | HR 83 | Temp 98.0°F | Ht 67.0 in | Wt 180.4 lb

## 2023-12-14 DIAGNOSIS — Z1211 Encounter for screening for malignant neoplasm of colon: Secondary | ICD-10-CM | POA: Diagnosis not present

## 2023-12-14 DIAGNOSIS — F17200 Nicotine dependence, unspecified, uncomplicated: Secondary | ICD-10-CM | POA: Diagnosis not present

## 2023-12-14 DIAGNOSIS — K219 Gastro-esophageal reflux disease without esophagitis: Secondary | ICD-10-CM | POA: Diagnosis not present

## 2023-12-14 MED ORDER — LANSOPRAZOLE 30 MG PO CPDR: 30.0000 mg | DELAYED_RELEASE_CAPSULE | Freq: Every day | ORAL | 3 refills | Status: AC

## 2023-12-14 NOTE — Progress Notes (Signed)
 Subjective:    Patient ID: Beth Reynolds, female    DOB: 06/18/1970, 53 y.o.   MRN: [id number]  HPI  Wt Readings from Last 3 Encounters:  12/14/23 180 lb 6 oz (81.8 kg)  07/20/23 185 lb 2 oz (84 kg)  11/04/22 178 lb 3.2 oz (80.8 kg)   28.25 kg/m  Vitals:   12/14/23 1400  BP: 126/72  Pulse: 83  Temp: 98 F (36.7 C)  SpO2: 99%    Pt presents with c/o  Acid reflux -follow up  Colon cancer screening  Smoking status unchanged    Last visit for GERD was April 2025  Noted that lansoprazole  was no longer as effective especially after GI illness We continued this 30 mg daily  Added famotidine  20 mg bid   Also encouraged florastart for 2 wk post noro virus   Was referred to GI as well for this and colon cancer screening  She   Discussed smoking cessation as this can add to GERD   No symptoms  Has been a while   Saw GI in [place]  Noted at that time that symptoms improved  Discussed a colonoscopy- decided against it   Would rather not screen for colon cancer at all   Just returned from [place] again Mother died at 37 -unsure what of (poor medical care)   Pt currently lives in [place]     Patient Active Problem List   Diagnosis Date Noted   Vitamin D  deficiency 11/04/2022   Current use of proton pump inhibitor 10/28/2022   Encounter for hepatitis C screening test for low risk patient 10/08/2021   Stress reaction 08/05/2020   Screening mammogram, encounter for 01/29/2020   Encounter for routine gynecological examination 01/29/2020   Colon cancer screening 01/29/2020   Neck pain 01/29/2020   Low back pain 02/06/2018   Essential hypertension 02/06/2018   IBS (irritable bowel syndrome) 01/17/2012   Prediabetes 10/09/2010   Routine general medical examination at a health care facility 10/04/2010   MENOPAUSE, EARLY 11/03/2009   Menopausal and postmenopausal disorder 10/11/2009   Migraine 01/19/2008   TOBACCO ABUSE 07/17/2007   Adjustment  disorder with mixed anxiety and depressed mood 07/17/2007   Allergic rhinitis 07/17/2007   GERD 07/17/2007   Past Medical History:  Diagnosis Date   Allergic rhinoconjunctivitis    Anxiety    Early menopause    Migraine    PMDD (premenstrual dysphoric disorder)    TB (tuberculosis), treated    Tobacco abuse    Past Surgical History:  Procedure Laterality Date   DILATION AND CURETTAGE OF UTERUS     LESION REMOVAL     from finger   Social History   Tobacco Use   Smoking status: Every Day    Current packs/day: 0.25    Types: Cigarettes    Passive exposure: Past   Smokeless tobacco: Never  Substance Use Topics   Alcohol use: No    Alcohol/week: 0.0 standard drinks of alcohol   Drug use: No   Family History  Problem Relation Age of Onset   Diabetes Father    Allergies  Allergen Reactions   Avocado     Swollen lips   Paroxetine     REACTION: sever headache, not able to sleep and weight gain   Pineapple     Swelling of lips   Venlafaxine     REACTION: HA   Current Outpatient Medications on File Prior to Visit  Medication Sig Dispense Refill  amLODipine  (NORVASC ) 5 MG tablet TAKE 1 TABLET (5 MG TOTAL) BY MOUTH DAILY. 90 tablet 0   cetirizine (ZYRTEC) 10 MG tablet Take 10 mg by mouth daily as needed for allergies.     Fluoxetine  HCl, PMDD, 20 MG TABS TAKE 1 AND 1/2 TABLETS DAILY BY MOUTH 135 tablet 1   saccharomyces boulardii (FLORASTART) 250 MG capsule Take 1 capsule (250 mg total) by mouth 2 (two) times daily. 28 capsule 0   No current facility-administered medications on file prior to visit.    Review of Systems  Constitutional:  Negative for activity change, appetite change, fatigue, fever and unexpected weight change.  HENT:  Negative for congestion, ear pain, rhinorrhea, sinus pressure and sore throat.   Eyes:  Negative for pain, redness and visual disturbance.  Respiratory:  Negative for cough, shortness of breath and wheezing.   Cardiovascular:  Negative  for chest pain and palpitations.  Gastrointestinal:  Negative for abdominal pain, blood in stool, constipation and diarrhea.  Endocrine: Negative for polydipsia and polyuria.  Genitourinary:  Negative for dysuria, frequency and urgency.  Musculoskeletal:  Negative for arthralgias, back pain and myalgias.  Skin:  Negative for pallor and rash.  Allergic/Immunologic: Negative for environmental allergies.  Neurological:  Negative for dizziness, syncope and headaches.  Hematological:  Negative for adenopathy. Does not bruise/bleed easily.  Psychiatric/Behavioral:  Negative for decreased concentration and dysphoric mood. The patient is not nervous/anxious.        Grief after loss of mother   All family is in [place]        Objective:   Physical Exam Constitutional:      General: She is not in acute distress.    Appearance: Normal appearance. She is well-developed and normal weight. She is not ill-appearing or diaphoretic.  HENT:     Head: Normocephalic and atraumatic.  Eyes:     General: No scleral icterus.    Conjunctiva/sclera: Conjunctivae normal.     Pupils: Pupils are equal, round, and reactive to light.  Cardiovascular:     Rate and Rhythm: Normal rate and regular rhythm.     Heart sounds: Normal heart sounds.  Pulmonary:     Effort: Pulmonary effort is normal. No respiratory distress.     Breath sounds: Normal breath sounds. No wheezing or rales.  Abdominal:     General: Abdomen is protuberant. Bowel sounds are normal. There is no distension.     Palpations: Abdomen is soft. There is no hepatomegaly, splenomegaly or mass.     Tenderness: There is no abdominal tenderness. There is no guarding or rebound.     Hernia: No hernia is present.  Musculoskeletal:     Cervical back: Normal range of motion and neck supple.  Lymphadenopathy:     Cervical: No cervical adenopathy.  Skin:    General: Skin is warm and dry.     Coloration: Skin is not pale.     Findings: No  erythema.  Neurological:     Mental Status: She is alert.  Psychiatric:        Mood and Affect: Mood normal.     Comments: Candidly discusses symptoms and stressors  - grief            Assessment & Plan:   Problem List Items Addressed This Visit       Digestive   GERD - Primary   Improved with lansoprazole  30 mg daily and famotidine  20 mg at bedtime No symptoms in a while Ready to  d/c famotidine  and see how she does  Did see GI in [place] - no EGD because symptoms were better   Encouraged to avoid triggers Lansoprazole  30 mg daily refilled  D/c famotidine  for now  Will call if symptoms return Handout given  Call back and Er precautions noted in detail today        Relevant Medications   lansoprazole  (PREVACID ) 30 MG capsule     Other   TOBACCO ABUSE   Disc in detail risks of smoking and possible outcomes including copd, vascular/ heart disease, cancer , respiratory and sinus infections as well as osteoporosis  Pt voices understanding  Pt is not ready to quit       Colon cancer screening   After discussion with both us  and GI (in [place]) decided against any screening whatsoever Encouraged to call if she changes her mind Discussed opt of cologuard and colonoscopy

## 2023-12-14 NOTE — Patient Instructions (Addendum)
 Continue the generic lansoprazole   Stop the famotidine  for now to see if you need it any more  Take care of yourself   Avoid the dietary triggers that trigger you  Keep thinking about quitting smoking   If symptoms worsen let us  know    If you change your mind about colon cancer screening let us  know

## 2023-12-14 NOTE — Assessment & Plan Note (Signed)
 Improved with lansoprazole  30 mg daily and famotidine  20 mg at bedtime No symptoms in a while Ready to d/c famotidine  and see how she does  Did see GI in [place] - no EGD because symptoms were better   Encouraged to avoid triggers Lansoprazole  30 mg daily refilled  D/c famotidine  for now  Will call if symptoms return Handout given  Call back and Er precautions noted in detail today

## 2023-12-14 NOTE — Assessment & Plan Note (Signed)
 Disc in detail risks of smoking and possible outcomes including copd, vascular/ heart disease, cancer , respiratory and sinus infections as well as osteoporosis  Pt voices understanding  Pt is not ready to quit

## 2023-12-14 NOTE — Assessment & Plan Note (Signed)
 After discussion with both us  and GI (in [place]) decided against any screening whatsoever Encouraged to call if she changes her mind Discussed opt of cologuard and colonoscopy

## 2023-12-21 ENCOUNTER — Other Ambulatory Visit: Payer: Self-pay | Admitting: Family Medicine

## 2023-12-21 DIAGNOSIS — I10 Essential (primary) hypertension: Secondary | ICD-10-CM

## 2023-12-21 NOTE — Telephone Encounter (Signed)
 Lvm and sent Mychart

## 2023-12-21 NOTE — Telephone Encounter (Signed)
 E-scribed refill.  Pls schedule CPE and fasting labs (no food/drink- except water and/or blk coffee 5 hrs prior) for additional refills.

## 2023-12-23 ENCOUNTER — Telehealth: Payer: Self-pay | Admitting: *Deleted

## 2023-12-23 NOTE — Telephone Encounter (Signed)
 Please schedule fasting labs prior to CPE thanks

## 2023-12-23 NOTE — Telephone Encounter (Signed)
 Copied from CRM #[id number]. Topic: Clinical - Request for Lab/Test Order >> Dec 23, 2023 12:10 PM Beth Reynolds wrote: Reason for CRM: Patient requesting lab orders for physical scheduled 11/26

## 2024-02-12 ENCOUNTER — Telehealth: Payer: Self-pay | Admitting: Family Medicine

## 2024-02-12 DIAGNOSIS — Z79899 Other long term (current) drug therapy: Secondary | ICD-10-CM

## 2024-02-12 DIAGNOSIS — I10 Essential (primary) hypertension: Secondary | ICD-10-CM

## 2024-02-12 DIAGNOSIS — R7303 Prediabetes: Secondary | ICD-10-CM

## 2024-02-12 DIAGNOSIS — E559 Vitamin D deficiency, unspecified: Secondary | ICD-10-CM

## 2024-02-12 NOTE — Telephone Encounter (Signed)
-----   Message from Veva JINNY Ferrari sent at 02/07/2024 10:27 AM EST ----- Regarding: Lab orders for Wed, 11.19.25 Patient is scheduled for CPX labs, please order future labs, Thanks , Veva

## 2024-02-15 ENCOUNTER — Other Ambulatory Visit (INDEPENDENT_AMBULATORY_CARE_PROVIDER_SITE_OTHER)

## 2024-02-15 DIAGNOSIS — I10 Essential (primary) hypertension: Secondary | ICD-10-CM | POA: Diagnosis not present

## 2024-02-15 DIAGNOSIS — R7303 Prediabetes: Secondary | ICD-10-CM | POA: Diagnosis not present

## 2024-02-15 DIAGNOSIS — Z79899 Other long term (current) drug therapy: Secondary | ICD-10-CM

## 2024-02-15 DIAGNOSIS — E559 Vitamin D deficiency, unspecified: Secondary | ICD-10-CM | POA: Diagnosis not present

## 2024-02-15 LAB — COMPREHENSIVE METABOLIC PANEL WITH GFR
ALT: 16 U/L (ref 0–35)
AST: 16 U/L (ref 0–37)
Albumin: 4.2 g/dL (ref 3.5–5.2)
Alkaline Phosphatase: 155 U/L — ABNORMAL HIGH (ref 39–117)
BUN: 8 mg/dL (ref 6–23)
CO2: 27 meq/L (ref 19–32)
Calcium: 9.1 mg/dL (ref 8.4–10.5)
Chloride: 102 meq/L (ref 96–112)
Creatinine, Ser: 0.72 mg/dL (ref 0.40–1.20)
GFR: 93.91 mL/min (ref >60.00)
Glucose, Bld: 110 mg/dL — ABNORMAL HIGH (ref 70–99)
Potassium: 3.9 meq/L (ref 3.5–5.1)
Sodium: 137 meq/L (ref 135–145)
Total Bilirubin: 0.4 mg/dL (ref 0.2–1.2)
Total Protein: 7.1 g/dL (ref 6.0–8.3)

## 2024-02-15 LAB — CBC WITH DIFFERENTIAL/PLATELET
Basophils Absolute: 0.1 K/uL (ref 0.0–0.1)
Basophils Relative: 0.8 % (ref 0.0–3.0)
Eosinophils Absolute: 0.3 K/uL (ref 0.0–0.7)
Eosinophils Relative: 2.4 % (ref 0.0–5.0)
HCT: 37.4 % (ref 36.0–46.0)
Hemoglobin: 12.5 g/dL (ref 12.0–15.0)
Lymphocytes Relative: 36.9 % (ref 12.0–46.0)
Lymphs Abs: 3.9 K/uL (ref 0.7–4.0)
MCHC: 33.3 g/dL (ref 30.0–36.0)
MCV: 83.5 fl (ref 78.0–100.0)
Monocytes Absolute: 0.7 K/uL (ref 0.1–1.0)
Monocytes Relative: 6.9 % (ref 3.0–12.0)
Neutro Abs: 5.6 K/uL (ref 1.4–7.7)
Neutrophils Relative %: 53 % (ref 43.0–77.0)
Platelets: 457 K/uL — ABNORMAL HIGH (ref 150.0–400.0)
RBC: 4.49 Mil/uL (ref 3.87–5.11)
RDW: 14.1 % (ref 11.5–15.5)
WBC: 10.5 K/uL (ref 4.0–10.5)

## 2024-02-15 LAB — LIPID PANEL
Cholesterol: 129 mg/dL (ref 0–200)
HDL: 34.1 mg/dL — ABNORMAL LOW (ref >39.00)
LDL Cholesterol: 45 mg/dL (ref 0–99)
NonHDL: 95.36
Total CHOL/HDL Ratio: 4
Triglycerides: 251 mg/dL — ABNORMAL HIGH (ref 0.0–149.0)
VLDL: 50.2 mg/dL — ABNORMAL HIGH (ref 0.0–40.0)

## 2024-02-15 LAB — VITAMIN D 25 HYDROXY (VIT D DEFICIENCY, FRACTURES): VITD: 21.06 ng/mL — ABNORMAL LOW (ref 30.00–100.00)

## 2024-02-15 LAB — VITAMIN B12: Vitamin B-12: 397 pg/mL (ref 211–911)

## 2024-02-15 LAB — TSH: TSH: 1.22 u[IU]/mL (ref 0.35–5.50)

## 2024-02-16 ENCOUNTER — Ambulatory Visit: Payer: Self-pay | Admitting: Family Medicine

## 2024-02-16 LAB — HEMOGLOBIN A1C: Hgb A1c MFr Bld: 6.2 % (ref 4.6–6.5)

## 2024-02-22 ENCOUNTER — Encounter: Payer: Self-pay | Admitting: Family Medicine

## 2024-02-22 ENCOUNTER — Ambulatory Visit (INDEPENDENT_AMBULATORY_CARE_PROVIDER_SITE_OTHER): Admitting: Family Medicine

## 2024-02-22 VITALS — BP 136/80 | HR 93 | Temp 98.7°F | Ht 66.5 in | Wt 178.5 lb

## 2024-02-22 DIAGNOSIS — K219 Gastro-esophageal reflux disease without esophagitis: Secondary | ICD-10-CM

## 2024-02-22 DIAGNOSIS — Z23 Encounter for immunization: Secondary | ICD-10-CM | POA: Diagnosis not present

## 2024-02-22 DIAGNOSIS — I10 Essential (primary) hypertension: Secondary | ICD-10-CM | POA: Diagnosis not present

## 2024-02-22 DIAGNOSIS — F4323 Adjustment disorder with mixed anxiety and depressed mood: Secondary | ICD-10-CM

## 2024-02-22 DIAGNOSIS — E559 Vitamin D deficiency, unspecified: Secondary | ICD-10-CM

## 2024-02-22 DIAGNOSIS — Z79899 Other long term (current) drug therapy: Secondary | ICD-10-CM | POA: Diagnosis not present

## 2024-02-22 DIAGNOSIS — F4320 Adjustment disorder, unspecified: Secondary | ICD-10-CM | POA: Insufficient documentation

## 2024-02-22 DIAGNOSIS — F17200 Nicotine dependence, unspecified, uncomplicated: Secondary | ICD-10-CM

## 2024-02-22 DIAGNOSIS — L309 Dermatitis, unspecified: Secondary | ICD-10-CM | POA: Insufficient documentation

## 2024-02-22 DIAGNOSIS — Z0000 Encounter for general adult medical examination without abnormal findings: Secondary | ICD-10-CM

## 2024-02-22 DIAGNOSIS — Z1211 Encounter for screening for malignant neoplasm of colon: Secondary | ICD-10-CM

## 2024-02-22 DIAGNOSIS — R7303 Prediabetes: Secondary | ICD-10-CM

## 2024-02-22 MED ORDER — TRIAMCINOLONE ACETONIDE 0.1 % EX CREA: 1.0000 | TOPICAL_CREAM | Freq: Two times a day (BID) | CUTANEOUS | 1 refills | Status: AC | PRN

## 2024-02-22 NOTE — Assessment & Plan Note (Signed)
 Last vitamin D  Lab Results  Component Value Date   VD25OH 21.06 (L) 02/15/2024   On ppi Does not tolerate ca   Will start vitamin D3 over the counter 2000 international units daily

## 2024-02-22 NOTE — Assessment & Plan Note (Signed)
 Disc in detail risks of smoking and possible outcomes including copd, vascular/ heart disease, cancer , respiratory and sinus infections as well as osteoporosis  Pt voices understanding  Pt is not ready to quit

## 2024-02-22 NOTE — Patient Instructions (Addendum)
 If you are interested in the shingles vaccine series (Shingrix), call your insurance or pharmacy to check on coverage and location it must be given.  If affordable - you can schedule it here or at your pharmacy depending on coverage   Pneumonia vaccine today   Get vitamin D3 over the counter and 2000 international units daily   Get back to exercise when you are ready   Let us  know if you want to talk to a grief counselor in the future    Avoid added sugars in your diet when you can  Try to get most of your carbohydrates from produce (with the exception of white potatoes) and whole grains Eat less bread/pasta/rice/snack foods/cereals/sweets and other items from the middle of the grocery store (processed carbs) Avoid red meat/ fried foods/ egg yolks/ fatty breakfast meats/ butter, cheese and high fat dairy/ and shellfish   Continue moisturizers  Avoid harsh chemicals and scents and hot water Try the triamcinolone  cream Look at the handout for eczema

## 2024-02-22 NOTE — Assessment & Plan Note (Signed)
 Prediabetes  Lab Results  Component Value Date   HGBA1C 6.2 02/15/2024   HGBA1C 6.2 10/28/2022   HGBA1C 5.8 (A) 01/04/2022    disc imp of low glycemic diet and wt loss to prevent DM2

## 2024-02-22 NOTE — Assessment & Plan Note (Signed)
 bp in fair control at this time  BP Readings from Last 1 Encounters:  02/22/24 136/80   No changes needed Most recent labs reviewed  Disc lifstyle change with low sodium diet and exercise  Continues amlodipine  5 mg daily

## 2024-02-22 NOTE — Progress Notes (Signed)
 Subjective:    Patient ID: Beth Reynolds, female    DOB: 05-May-1970, 53 y.o.   MRN: [id number]  HPI  Here for health maintenance exam and to review chronic medical problems  Also itchy skin  Wt Readings from Last 3 Encounters:  02/22/24 178 lb 8 oz (81 kg)  12/14/23 180 lb 6 oz (81.8 kg)  07/20/23 185 lb 2 oz (84 kg)   28.38 kg/m  Vitals:   02/22/24 1001 02/22/24 1029  BP: (!) 142/88 136/80  Pulse: 93   Temp: 98.7 F (37.1 C)   SpO2: 98%     Immunization History  Administered Date(s) Administered   Influenza Split 02/17/2011, 01/17/2012   Influenza, Seasonal, Injecte, Preservative Fre 12/31/2022, 06/14/2023   Influenza,inj,Quad PF,6+ Mos 01/29/2020   Influenza,trivalent, recombinat, inj, PF 12/20/2023   PNEUMOCOCCAL CONJUGATE-20 02/22/2024   PPD Test 11/01/2014   Pfizer Covid-19 Vaccine Bivalent Booster 18yrs & up 06/14/2023   Tdap 10/09/2010    Health Maintenance Due  Topic Date Due   Hepatitis B Vaccines 19-59 Average Risk (1 of 3 - 19+ 3-dose series) Never done   COVID-19 Vaccine (5 - 2025-26 season) 11/28/2023    Itchy skin on hands /feet Has to scrub with a wire brush  Very dry Using aaveno baby lotion Has had a long time/worse lately    Flu shot - sept  Pneumonia vaccine -interested today  Shingrix - may want in future /req info    Mammogram- declines any breast cancer screen Self breast exam- no lumps   Gyn health Pap 01/2020 normal with neg HPV   Colon cancer screening  Declines all   Bone health   Falls-none  Fractures-none  Supplements -took ca and D then stopped due to constipation  Last vitamin D  Lab Results  Component Value Date   VD25OH 21.06 (L) 02/15/2024    Exercise  Has slacked off after travel and losing her mom Plans to start walking and reformer pilates   Smoking status - 1 ppd  Not ready to quit  Has thought about it a lot since her mom passed  Some back pain     Mood    02/22/2024   10:29 AM  07/20/2023   10:24 AM 11/04/2022    9:20 AM 10/08/2021    9:03 AM 01/29/2020   11:39 AM  Depression screen PHQ 2/9  Decreased Interest 0 0 0 0 0  Down, Depressed, Hopeless 0 0 0 0 0  PHQ - 2 Score 0 0 0 0 0  Altered sleeping 0 1 0    Tired, decreased energy 0 0 2    Change in appetite 0 0 0    Feeling bad or failure about yourself  0 0 0    Trouble concentrating 0 0 0    Moving slowly or fidgety/restless 0 0 0    Suicidal thoughts 0 0 0    PHQ-9 Score 0 1  2     Difficult doing work/chores Not difficult at all Not difficult at all Not difficult at all       Data saved with a previous flowsheet row definition   Anx/dep Lost her mother (out of the country) -- unexpected , in [place] , had abd pain but not sure what caused her to die  Good support-siblings  Takes fluoxetine  30 mg daily  Helps menopausal mood symptoms   Copes by playing a concentration game   HTN bp is stable today  No cp or palpitations or  headaches or edema  No side effects to medicines  BP Readings from Last 3 Encounters:  02/22/24 136/80  12/14/23 126/72  07/20/23 136/84     Lab Results  Component Value Date   NA 137 02/15/2024   K 3.9 02/15/2024   CO2 27 02/15/2024   GLUCOSE 110 (H) 02/15/2024   BUN 8 02/15/2024   CREATININE 0.72 02/15/2024   CALCIUM 9.1 02/15/2024   GFR 93.91 02/15/2024   Amlodipine  5 mg daily   GERD Prevacid  30 mg daily  Avoids triggers  Lab Results  Component Value Date   VITAMINB12 397 02/15/2024   Prediabetes Lab Results  Component Value Date   HGBA1C 6.2 02/15/2024   HGBA1C 6.2 10/28/2022   HGBA1C 5.8 (A) 01/04/2022     Lipids Lab Results  Component Value Date   CHOL 129 02/15/2024   CHOL 149 10/28/2022   CHOL 144 10/08/2021   Lab Results  Component Value Date   HDL 34.10 (L) 02/15/2024   HDL 34.30 (L) 10/28/2022   HDL 37.40 (L) 10/08/2021   Lab Results  Component Value Date   LDLCALC 45 02/15/2024   LDLCALC 73 10/03/2017   LDLCALC 71 10/05/2010    Lab Results  Component Value Date   TRIG 251.0 (H) 02/15/2024   TRIG 237.0 (H) 10/28/2022   TRIG 257.0 (H) 10/08/2021   Lab Results  Component Value Date   CHOLHDL 4 02/15/2024   CHOLHDL 4 10/28/2022   CHOLHDL 4 10/08/2021   Lab Results  Component Value Date   LDLDIRECT 96.0 10/28/2022   LDLDIRECT 84.0 10/08/2021   LDLDIRECT 105.0 01/22/2020   Eats too much sugar   Lab Results  Component Value Date   ALT 16 02/15/2024   AST 16 02/15/2024   ALKPHOS 155 (H) 02/15/2024   BILITOT 0.4 02/15/2024    Lab Results  Component Value Date   WBC 10.5 02/15/2024   HGB 12.5 02/15/2024   HCT 37.4 02/15/2024   MCV 83.5 02/15/2024   PLT 457.0 (H) 02/15/2024   Lab Results  Component Value Date   TSH 1.22 02/15/2024     Patient Active Problem List   Diagnosis Date Noted   Eczema 02/22/2024   Grief reaction 02/22/2024   Vitamin D  deficiency 11/04/2022   Current use of proton pump inhibitor 10/28/2022   Encounter for hepatitis C screening test for low risk patient 10/08/2021   Stress reaction 08/05/2020   Screening mammogram, encounter for 01/29/2020   Encounter for routine gynecological examination 01/29/2020   Colon cancer screening 01/29/2020   Neck pain 01/29/2020   Low back pain 02/06/2018   Essential hypertension 02/06/2018   IBS (irritable bowel syndrome) 01/17/2012   Prediabetes 10/09/2010   Routine general medical examination at a health care facility 10/04/2010   MENOPAUSE, EARLY 11/03/2009   Menopausal and postmenopausal disorder 10/11/2009   Migraine 01/19/2008   TOBACCO ABUSE 07/17/2007   Adjustment disorder with mixed anxiety and depressed mood 07/17/2007   Allergic rhinitis 07/17/2007   GERD 07/17/2007   Past Medical History:  Diagnosis Date   Allergic rhinoconjunctivitis    Anxiety    Early menopause    Migraine    PMDD (premenstrual dysphoric disorder)    TB (tuberculosis), treated    Tobacco abuse    Past Surgical History:  Procedure  Laterality Date   DILATION AND CURETTAGE OF UTERUS     LESION REMOVAL     from finger   Social History   Tobacco Use   Smoking  status: Every Day    Current packs/day: 0.25    Types: Cigarettes    Passive exposure: Past   Smokeless tobacco: Never  Substance Use Topics   Alcohol use: No    Alcohol/week: 0.0 standard drinks of alcohol   Drug use: No   Family History  Problem Relation Age of Onset   Diabetes Father    Allergies  Allergen Reactions   Avocado     Swollen lips   Paroxetine     REACTION: sever headache, not able to sleep and weight gain   Pineapple     Swelling of lips   Venlafaxine     REACTION: HA   Current Outpatient Medications on File Prior to Visit  Medication Sig Dispense Refill   amLODipine  (NORVASC ) 5 MG tablet TAKE 1 TABLET (5 MG TOTAL) BY MOUTH DAILY. 90 tablet 0   cetirizine (ZYRTEC) 10 MG tablet Take 10 mg by mouth daily as needed for allergies.     Fluoxetine  HCl, PMDD, 20 MG TABS TAKE 1 AND 1/2 TABLETS DAILY BY MOUTH 135 tablet 1   lansoprazole  (PREVACID ) 30 MG capsule Take 1 capsule (30 mg total) by mouth daily. 90 capsule 3   No current facility-administered medications on file prior to visit.    Review of Systems  Constitutional:  Positive for fatigue. Negative for activity change, appetite change, fever and unexpected weight change.  HENT:  Negative for congestion, ear pain, rhinorrhea, sinus pressure and sore throat.   Eyes:  Negative for pain, redness and visual disturbance.  Respiratory:  Negative for cough, shortness of breath and wheezing.   Cardiovascular:  Negative for chest pain and palpitations.  Gastrointestinal:  Negative for abdominal pain, blood in stool, constipation and diarrhea.  Endocrine: Negative for polydipsia and polyuria.  Genitourinary:  Negative for dysuria, frequency and urgency.  Musculoskeletal:  Negative for arthralgias, back pain and myalgias.  Skin:  Positive for rash. Negative for pallor.   Allergic/Immunologic: Negative for environmental allergies.  Neurological:  Negative for dizziness, syncope and headaches.  Hematological:  Negative for adenopathy. Does not bruise/bleed easily.  Psychiatric/Behavioral:  Positive for dysphoric mood. Negative for decreased concentration. The patient is not nervous/anxious.        Objective:   Physical Exam Constitutional:      General: She is not in acute distress.    Appearance: Normal appearance. She is well-developed and normal weight. She is not ill-appearing or diaphoretic.  HENT:     Head: Normocephalic and atraumatic.     Right Ear: Tympanic membrane, ear canal and external ear normal.     Left Ear: Tympanic membrane, ear canal and external ear normal.     Nose: Nose normal. No congestion.     Mouth/Throat:     Mouth: Mucous membranes are moist.     Pharynx: Oropharynx is clear. No posterior oropharyngeal erythema.  Eyes:     General: No scleral icterus.    Extraocular Movements: Extraocular movements intact.     Conjunctiva/sclera: Conjunctivae normal.     Pupils: Pupils are equal, round, and reactive to light.  Neck:     Thyroid : No thyromegaly.     Vascular: No carotid bruit or JVD.  Cardiovascular:     Rate and Rhythm: Normal rate and regular rhythm.     Pulses: Normal pulses.     Heart sounds: Normal heart sounds.     No gallop.  Pulmonary:     Effort: Pulmonary effort is normal. No respiratory distress.  Breath sounds: Normal breath sounds. No wheezing.     Comments: Diffusely distant bs  Chest:     Chest wall: No tenderness.  Abdominal:     General: Bowel sounds are normal. There is no distension or abdominal bruit.     Palpations: Abdomen is soft. There is no mass.     Tenderness: There is no abdominal tenderness.     Hernia: No hernia is present.  Genitourinary:    Comments: Breast exam: No mass, nodules, thickening, tenderness, bulging, retraction, inflamation, nipple discharge or skin changes noted.   No axillary or clavicular LA.     Musculoskeletal:        General: No tenderness. Normal range of motion.     Cervical back: Normal range of motion and neck supple. No rigidity. No muscular tenderness.     Right lower leg: No edema.     Left lower leg: No edema.     Comments: No kyphosis   Lymphadenopathy:     Cervical: No cervical adenopathy.  Skin:    General: Skin is warm and dry.     Coloration: Skin is not pale.     Findings: No erythema or rash.     Comments: Dry skin on dorsal hands /feet  Some on ankles   Patchy Few excoriations     Neurological:     Mental Status: She is alert. Mental status is at baseline.     Cranial Nerves: No cranial nerve deficit.     Motor: No abnormal muscle tone.     Coordination: Coordination normal.     Gait: Gait normal.     Deep Tendon Reflexes: Reflexes are normal and symmetric. Reflexes normal.  Psychiatric:        Mood and Affect: Mood normal. Affect is tearful.        Cognition and Memory: Cognition and memory normal.     Comments: Tearful when discussing grief Candidly discusses symptoms and stressors             Assessment & Plan:   Problem List Items Addressed This Visit       Cardiovascular and Mediastinum   Essential hypertension   bp in fair control at this time  BP Readings from Last 1 Encounters:  02/22/24 136/80   No changes needed Most recent labs reviewed  Disc lifstyle change with low sodium diet and exercise  Continues amlodipine  5 mg daily          Digestive   GERD   Continues prevacid  30 mg daily  Good control  Avoids triggers         Musculoskeletal and Integument   Eczema   Itchy dry skin on hands and feet  Chronic and excoriated from scratching   Discussed use of gentle scent free moisturizers  Avoid hot water /harsh detergents  Prescription triamcinolone  cream to use prn  Update if not starting to improve in a week or if worsening   Given handout          Other   Vitamin D   deficiency   Last vitamin D  Lab Results  Component Value Date   VD25OH 21.06 (L) 02/15/2024   On ppi Does not tolerate ca   Will start vitamin D3 over the counter 2000 international units daily      TOBACCO ABUSE   Disc in detail risks of smoking and possible outcomes including copd, vascular/ heart disease, cancer , respiratory and sinus infections as well as osteoporosis  Pt voices  understanding  Pt is not ready to quit       Routine general medical examination at a health care facility - Primary   Reviewed health habits including diet and exercise and skin cancer prevention Reviewed appropriate screening tests for age  Also reviewed health mt list, fam hx and immunization status , as well as social and family history   See HPI Labs reviewed and ordered Health Maintenance  Topic Date Due   Hepatitis B Vaccine (1 of 3 - 19+ 3-dose series) Never done   COVID-19 Vaccine (5 - 2025-26 season) 11/28/2023   Breast Cancer Screening  02/21/2025*   Colon Cancer Screening  02/21/2025*   Zoster (Shingles) Vaccine (1 of 2) 10/18/2025*   HIV Screening  10/08/2027*   Pap with HPV screening  01/28/2025   Pneumococcal Vaccine for age over 9  Completed   Flu Shot  Completed   Hepatitis C Screening  Completed   HPV Vaccine  Aged Out   Meningitis B Vaccine  Aged Out   DTaP/Tdap/Td vaccine  Discontinued  *Topic was postponed. The date shown is not the original due date.   Prevnar 20 vaccine given Declines any breast or colon cancer screening Discussed fall prevention, supplements and exercise for bone density  No falls or fractures Discussed need for smoking cessation  Discussed need for vitamin D  supplement  PHq 0 with grief  Counseling offered      Prediabetes   Prediabetes  Lab Results  Component Value Date   HGBA1C 6.2 02/15/2024   HGBA1C 6.2 10/28/2022   HGBA1C 5.8 (A) 01/04/2022    disc imp of low glycemic diet and wt loss to prevent DM2        Grief reaction    Recent unexpected loss of mother in [place]  Still settling affairs Has support of sibs   PHQ0 Dealing well  Did offer referral for counseling   Continues fluoxetine        Current use of proton pump inhibitor   Lab Results  Component Value Date   VITAMINB12 397 02/15/2024   Last vitamin D  Lab Results  Component Value Date   VD25OH 21.06 (L) 02/15/2024   Encouraged to supplement D      Colon cancer screening   Declines colon cancer screening of any kind      Adjustment disorder with mixed anxiety and depressed mood   Worsened with recent grief after unexpectedly losing mother  Continues prozac  30 mg daily which is helpful  Reviewed stressors/ coping techniques/symptoms/ support sources/ tx options and side effects in detail today Offered grief counseling in future-she will reach out if interested       Other Visit Diagnoses       Need for pneumococcal 20-valent conjugate vaccination       Relevant Orders   Pneumococcal conjugate vaccine 20-valent (Prevnar 20) (Completed)

## 2024-02-22 NOTE — Assessment & Plan Note (Signed)
 Itchy dry skin on hands and feet  Chronic and excoriated from scratching   Discussed use of gentle scent free moisturizers  Avoid hot water /harsh detergents  Prescription triamcinolone  cream to use prn  Update if not starting to improve in a week or if worsening   Given handout

## 2024-02-22 NOTE — Assessment & Plan Note (Signed)
Declines colon cancer screening of any kind

## 2024-02-22 NOTE — Assessment & Plan Note (Signed)
 Worsened with recent grief after unexpectedly losing mother  Continues prozac  30 mg daily which is helpful  Reviewed stressors/ coping techniques/symptoms/ support sources/ tx options and side effects in detail today Offered grief counseling in future-she will reach out if interested

## 2024-02-22 NOTE — Assessment & Plan Note (Signed)
 Continues prevacid  30 mg daily  Good control  Avoids triggers

## 2024-02-22 NOTE — Assessment & Plan Note (Signed)
 Lab Results  Component Value Date   VITAMINB12 397 02/15/2024   Last vitamin D  Lab Results  Component Value Date   VD25OH 21.06 (L) 02/15/2024   Encouraged to supplement D

## 2024-02-22 NOTE — Assessment & Plan Note (Addendum)
 Reviewed health habits including diet and exercise and skin cancer prevention Reviewed appropriate screening tests for age  Also reviewed health mt list, fam hx and immunization status , as well as social and family history   See HPI Labs reviewed and ordered Health Maintenance  Topic Date Due   Hepatitis B Vaccine (1 of 3 - 19+ 3-dose series) Never done   COVID-19 Vaccine (5 - 2025-26 season) 11/28/2023   Breast Cancer Screening  02/21/2025*   Colon Cancer Screening  02/21/2025*   Zoster (Shingles) Vaccine (1 of 2) 10/18/2025*   HIV Screening  10/08/2027*   Pap with HPV screening  01/28/2025   Pneumococcal Vaccine for age over 26  Completed   Flu Shot  Completed   Hepatitis C Screening  Completed   HPV Vaccine  Aged Out   Meningitis B Vaccine  Aged Out   DTaP/Tdap/Td vaccine  Discontinued  *Topic was postponed. The date shown is not the original due date.   Prevnar 20 vaccine given Declines any breast or colon cancer screening Discussed fall prevention, supplements and exercise for bone density  No falls or fractures Discussed need for smoking cessation  Discussed need for vitamin D  supplement  PHq 0 with grief  Counseling offered

## 2024-02-22 NOTE — Assessment & Plan Note (Signed)
 Recent unexpected loss of mother in [place]  Still settling affairs Has support of sibs   PHQ0 Dealing well  Did offer referral for counseling   Continues fluoxetine 

## 2024-03-18 ENCOUNTER — Other Ambulatory Visit: Payer: Self-pay | Admitting: Family Medicine

## 2024-03-18 DIAGNOSIS — I10 Essential (primary) hypertension: Secondary | ICD-10-CM

## 2024-04-06 ENCOUNTER — Ambulatory Visit: Admitting: Family Medicine

## 2024-04-06 ENCOUNTER — Encounter: Payer: Self-pay | Admitting: Family Medicine

## 2024-04-06 VITALS — BP 132/74 | HR 97 | Temp 98.5°F | Resp 16 | Ht 66.5 in | Wt 182.5 lb

## 2024-04-06 DIAGNOSIS — U071 COVID-19: Secondary | ICD-10-CM

## 2024-04-06 DIAGNOSIS — R52 Pain, unspecified: Secondary | ICD-10-CM

## 2024-04-06 LAB — POC COVID19/FLU A&B COMBO
Covid Antigen, POC: POSITIVE — AB
Influenza A Antigen, POC: NEGATIVE
Influenza B Antigen, POC: NEGATIVE

## 2024-04-06 MED ORDER — NIRMATRELVIR/RITONAVIR (PAXLOVID)TABLET: 3.0000 | ORAL_TABLET | Freq: Two times a day (BID) | ORAL | 0 refills | Status: AC

## 2024-04-06 MED ORDER — BENZONATATE 100 MG PO CAPS: 100.0000 mg | ORAL_CAPSULE | Freq: Three times a day (TID) | ORAL | 0 refills | Status: AC | PRN

## 2024-04-06 NOTE — Progress Notes (Signed)
 Name: Beth Reynolds   MRN: [id number]    DOB: [birth year]   Date:04/06/2024       Progress Note  Subjective  Chief Complaint  Chief Complaint  Patient presents with   Sinusitis    Cough, congestion started yesterday also having post nasal drip   Generalized Body Aches    Discussed the use of AI scribe software for clinical note transcription with the patient, who gave verbal consent to proceed.  History of Present Illness Beth Reynolds is a 54 year old female who presents with symptoms of an upper respiratory infection.  She began feeling unwell yesterday morning with a headache, burning sensation in her nose, and general malaise. Today, her symptoms have worsened, including increased fatigue, nasal congestion, and a sore throat accompanied by a cough. She also describes 'achy' legs. 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  She has a history of hypertension. She denies any history of asthma or COPD. She has not had COVID-19 before and received her flu vaccine this season.    Patient Active Problem List   Diagnosis Date Noted   Eczema 02/22/2024   Grief reaction 02/22/2024   Vitamin D  deficiency 11/04/2022   Current use of proton pump inhibitor 10/28/2022   Encounter for hepatitis C screening test for low risk patient 10/08/2021   Stress reaction 08/05/2020   Screening mammogram, encounter for 01/29/2020   Encounter for routine gynecological examination 01/29/2020   Colon cancer screening 01/29/2020   Neck pain 01/29/2020   Low back pain 02/06/2018   Essential hypertension 02/06/2018   IBS (irritable bowel syndrome) 01/17/2012   Prediabetes 10/09/2010   Routine general medical examination at a health care facility 10/04/2010   MENOPAUSE, EARLY 11/03/2009   Menopausal and postmenopausal disorder 10/11/2009   Migraine 01/19/2008   TOBACCO ABUSE  07/17/2007   Adjustment disorder with mixed anxiety and depressed mood 07/17/2007   Allergic rhinitis 07/17/2007   GERD 07/17/2007    Social History   Tobacco Use   Smoking status: Every Day    Current packs/day: 0.25    Types: Cigarettes    Passive exposure: Past   Smokeless tobacco: Never  Substance Use Topics   Alcohol use: No    Alcohol/week: 0.0 standard drinks of alcohol    Current Medications[1]  Allergies[2]  ROS  Ten systems reviewed and is negative except as mentioned in HPI    Objective  Vitals:   04/06/24 1541  BP: 132/74  Pulse: 97  Resp: 16  Temp: 98.5 F (36.9 C)  TempSrc: Oral  SpO2: 97%  Weight: 182 lb 8 oz (82.8 kg)  Height: 5' 6.5 (1.689 m)    Body mass index is 29.01 kg/m.   Physical Exam  CONSTITUTIONAL: Patient appears well-developed and well-nourished. No distress. HEENT: Head atraumatic, normocephalic, neck supple. CARDIOVASCULAR: Normal rate, regular rhythm and normal heart sounds. No murmur heard. No BLE edema. PULMONARY: Effort normal and breath sounds normal. No respiratory distress. ABDOMINAL: There is no tenderness or distention. MUSCULOSKELETAL: Normal gait. Without gross motor or sensory deficit. PSYCHIATRIC: Patient has a normal mood and affect. Behavior is normal. Judgment and thought content normal.  Recent Results (from the past 2160 hours)  Vitamin B12     Status: None   Collection Time: 02/15/24 12:41 PM  Result Value Ref Range   Vitamin B-12 397 211 - 911 pg/mL  VITAMIN D   25 Hydroxy (Vit-D Deficiency, Fractures)     Status: Abnormal   Collection Time: 02/15/24 12:41 PM  Result Value Ref Range   VITD 21.06 (L) 30.00 - 100.00 ng/mL  Hemoglobin A1c     Status: None   Collection Time: 02/15/24 12:41 PM  Result Value Ref Range   Hgb A1c MFr Bld 6.2 4.6 - 6.5 %    Comment: Glycemic Control Guidelines for People with Diabetes:Non Diabetic:  <6%Goal of Therapy: <7%Additional Action Suggested:  >8%   Comprehensive  metabolic panel with GFR     Status: Abnormal   Collection Time: 02/15/24 12:41 PM  Result Value Ref Range   Sodium 137 135 - 145 mEq/L   Potassium 3.9 3.5 - 5.1 mEq/L   Chloride 102 96 - 112 mEq/L   CO2 27 19 - 32 mEq/L    Comment: Elevated LDH levels may cause falsely increased CO2 results. If LDH is >2000 U/L, a positive bias of 12% is possible.   Glucose, Bld 110 (H) 70 - 99 mg/dL   BUN 8 6 - 23 mg/dL   Creatinine, Ser 9.27 0.40 - 1.20 mg/dL   Total Bilirubin 0.4 0.2 - 1.2 mg/dL   Alkaline Phosphatase 155 (H) 39 - 117 U/L   AST 16 0 - 37 U/L   ALT 16 0 - 35 U/L   Total Protein 7.1 6.0 - 8.3 g/dL   Albumin 4.2 3.5 - 5.2 g/dL   GFR 06.08 >39.99 mL/min    Comment: Calculated using the CKD-EPI Creatinine Equation (2021)   Calcium 9.1 8.4 - 10.5 mg/dL  CBC with Differential/Platelet     Status: Abnormal   Collection Time: 02/15/24 12:41 PM  Result Value Ref Range   WBC 10.5 4.0 - 10.5 K/uL   RBC 4.49 3.87 - 5.11 Mil/uL   Hemoglobin 12.5 12.0 - 15.0 g/dL   HCT 62.5 63.9 - 53.9 %   MCV 83.5 78.0 - 100.0 fl   MCHC 33.3 30.0 - 36.0 g/dL   RDW 85.8 88.4 - 84.4 %   Platelets 457.0 (H) 150.0 - 400.0 K/uL   Neutrophils Relative % 53.0 43.0 - 77.0 %   Lymphocytes Relative 36.9 12.0 - 46.0 %   Monocytes Relative 6.9 3.0 - 12.0 %   Eosinophils Relative 2.4 0.0 - 5.0 %   Basophils Relative 0.8 0.0 - 3.0 %   Neutro Abs 5.6 1.4 - 7.7 K/uL   Lymphs Abs 3.9 0.7 - 4.0 K/uL   Monocytes Absolute 0.7 0.1 - 1.0 K/uL   Eosinophils Absolute 0.3 0.0 - 0.7 K/uL   Basophils Absolute 0.1 0.0 - 0.1 K/uL  Lipid panel     Status: Abnormal   Collection Time: 02/15/24 12:41 PM  Result Value Ref Range   Cholesterol 129 0 - 200 mg/dL    Comment: ATP III Classification       Desirable:  < 200 mg/dL               Borderline High:  200 - 239 mg/dL          High:  > = 759 mg/dL   Triglycerides 748.9 (H) 0.0 - 149.0 mg/dL    Comment: Normal:  <849 mg/dLBorderline High:  150 - 199 mg/dL   HDL 65.89 (L) >60.99  mg/dL   VLDL 49.7 (H) 0.0 - 59.9 mg/dL   LDL Cholesterol 45 0 - 99 mg/dL   Total CHOL/HDL Ratio 4     Comment:  Men          Women1/2 Average Risk     3.4          3.3Average Risk          5.0          4.42X Average Risk          9.6          7.13X Average Risk          15.0          11.0                       NonHDL 95.36     Comment: NOTE:  Non-HDL goal should be 30 mg/dL higher than patient's LDL goal (i.e. LDL goal of < 70 mg/dL, would have non-HDL goal of < 100 mg/dL)  TSH     Status: None   Collection Time: 02/15/24 12:41 PM  Result Value Ref Range   TSH 1.22 0.35 - 5.50 uIU/mL  POC Covid19/Flu A&B Antigen     Status: Abnormal   Collection Time: 04/06/24  3:50 PM  Result Value Ref Range   Influenza A Antigen, POC Negative Negative   Influenza B Antigen, POC Negative Negative   Covid Antigen, POC Positive (A) Negative      Assessment & Plan COVID-19 Acute COVID-19 infection with mild symptoms. Low risk for severe complications. Discussed Paxlovid  for symptom reduction. - Prescribed Paxlovid  if insurance allows. - Advised isolation for five days from symptom onset. - Recommended hydration, vitamin C, and zinc. - Prescribed Tessalon  Perles for cough. - Advised saline nasal spray and DayQuil. - Instructed to monitor oxygen levels and seek emergency care if needed. - Provided work excuse for Monday.  Hypertension Well-controlled with stable blood pressure. - Continue current hypertension management.              [1]  Current Outpatient Medications:    amLODipine  (NORVASC ) 5 MG tablet, TAKE 1 TABLET (5 MG TOTAL) BY MOUTH DAILY., Disp: 90 tablet, Rfl: 2   cetirizine (ZYRTEC) 10 MG tablet, Take 10 mg by mouth daily as needed for allergies., Disp: , Rfl:    Fluoxetine  HCl, PMDD, 20 MG TABS, TAKE 1 AND 1/2 TABLETS DAILY BY MOUTH, Disp: 135 tablet, Rfl: 1   lansoprazole  (PREVACID ) 30 MG capsule, Take 1 capsule (30 mg total) by mouth daily., Disp: 90  capsule, Rfl: 3   triamcinolone  cream (KENALOG ) 0.1 %, Apply 1 Application topically 2 (two) times daily as needed. To affected (eczema) areas, Disp: 30 g, Rfl: 1 [2]  Allergies Allergen Reactions   Avocado     Swollen lips   Paroxetine     REACTION: sever headache, not able to sleep and weight gain   Pineapple     Swelling of lips   Venlafaxine     REACTION: HA
